# Patient Record
Sex: Female | Born: 1965 | Race: White | Hispanic: No | Marital: Married | Smoking: Never smoker
Health system: Southern US, Community
[De-identification: ages and names within clinical notes are randomized; demographics above are authoritative.]

## PROBLEM LIST (undated history)

## (undated) DIAGNOSIS — Z8619 Personal history of other infectious and parasitic diseases: Secondary | ICD-10-CM

## (undated) DIAGNOSIS — Z9109 Other allergy status, other than to drugs and biological substances: Secondary | ICD-10-CM

## (undated) DIAGNOSIS — J302 Other seasonal allergic rhinitis: Secondary | ICD-10-CM

## (undated) DIAGNOSIS — I10 Essential (primary) hypertension: Secondary | ICD-10-CM

## (undated) DIAGNOSIS — K219 Gastro-esophageal reflux disease without esophagitis: Secondary | ICD-10-CM

## (undated) DIAGNOSIS — R519 Headache, unspecified: Secondary | ICD-10-CM

## (undated) DIAGNOSIS — M1990 Unspecified osteoarthritis, unspecified site: Secondary | ICD-10-CM

## (undated) DIAGNOSIS — E785 Hyperlipidemia, unspecified: Secondary | ICD-10-CM

## (undated) DIAGNOSIS — F419 Anxiety disorder, unspecified: Secondary | ICD-10-CM

## (undated) DIAGNOSIS — R51 Headache: Secondary | ICD-10-CM

## (undated) DIAGNOSIS — T7840XA Allergy, unspecified, initial encounter: Secondary | ICD-10-CM

## (undated) DIAGNOSIS — L732 Hidradenitis suppurativa: Secondary | ICD-10-CM

## (undated) DIAGNOSIS — E119 Type 2 diabetes mellitus without complications: Secondary | ICD-10-CM

## (undated) DIAGNOSIS — N390 Urinary tract infection, site not specified: Secondary | ICD-10-CM

## (undated) HISTORY — DX: Personal history of other infectious and parasitic diseases: Z86.19

## (undated) HISTORY — DX: Essential (primary) hypertension: I10

## (undated) HISTORY — PX: CHOLECYSTECTOMY: SHX55

## (undated) HISTORY — DX: Unspecified osteoarthritis, unspecified site: M19.90

## (undated) HISTORY — DX: Anxiety disorder, unspecified: F41.9

## (undated) HISTORY — DX: Headache: R51

## (undated) HISTORY — DX: Other seasonal allergic rhinitis: J30.2

## (undated) HISTORY — DX: Allergy, unspecified, initial encounter: T78.40XA

## (undated) HISTORY — DX: Type 2 diabetes mellitus without complications: E11.9

## (undated) HISTORY — DX: Gastro-esophageal reflux disease without esophagitis: K21.9

## (undated) HISTORY — DX: Hyperlipidemia, unspecified: E78.5

## (undated) HISTORY — PX: WISDOM TOOTH EXTRACTION: SHX21

## (undated) HISTORY — PX: TUBAL LIGATION: SHX77

## (undated) HISTORY — DX: Headache, unspecified: R51.9

## (undated) HISTORY — DX: Other allergy status, other than to drugs and biological substances: Z91.09

## (undated) HISTORY — DX: Urinary tract infection, site not specified: N39.0

## (undated) HISTORY — DX: Hidradenitis suppurativa: L73.2

## (undated) HISTORY — PX: AXILLARY SURGERY: SHX892

---

## 2007-03-29 DIAGNOSIS — J329 Chronic sinusitis, unspecified: Secondary | ICD-10-CM | POA: Insufficient documentation

## 2007-05-06 DIAGNOSIS — E669 Obesity, unspecified: Secondary | ICD-10-CM | POA: Insufficient documentation

## 2007-05-06 DIAGNOSIS — I10 Essential (primary) hypertension: Secondary | ICD-10-CM | POA: Insufficient documentation

## 2012-02-23 DIAGNOSIS — J45909 Unspecified asthma, uncomplicated: Secondary | ICD-10-CM | POA: Insufficient documentation

## 2014-12-22 ENCOUNTER — Telehealth: Payer: Self-pay | Admitting: Behavioral Health

## 2014-12-22 NOTE — Telephone Encounter (Signed)
Unable to reach patient at time of Pre-Visit Call.  Left message for patient to return call when available.    

## 2014-12-25 ENCOUNTER — Encounter: Payer: Self-pay | Admitting: Physician Assistant

## 2014-12-25 ENCOUNTER — Ambulatory Visit (INDEPENDENT_AMBULATORY_CARE_PROVIDER_SITE_OTHER): Payer: Managed Care, Other (non HMO) | Admitting: Physician Assistant

## 2014-12-25 VITALS — BP 124/88 | HR 83 | Temp 98.0°F | Resp 16 | Ht 62.25 in | Wt 222.0 lb

## 2014-12-25 DIAGNOSIS — M25649 Stiffness of unspecified hand, not elsewhere classified: Secondary | ICD-10-CM

## 2014-12-25 DIAGNOSIS — M25551 Pain in right hip: Secondary | ICD-10-CM

## 2014-12-25 DIAGNOSIS — M25559 Pain in unspecified hip: Secondary | ICD-10-CM | POA: Diagnosis not present

## 2014-12-25 DIAGNOSIS — R5382 Chronic fatigue, unspecified: Secondary | ICD-10-CM | POA: Diagnosis not present

## 2014-12-25 DIAGNOSIS — G8929 Other chronic pain: Secondary | ICD-10-CM | POA: Insufficient documentation

## 2014-12-25 DIAGNOSIS — Z8679 Personal history of other diseases of the circulatory system: Secondary | ICD-10-CM | POA: Insufficient documentation

## 2014-12-25 DIAGNOSIS — K219 Gastro-esophageal reflux disease without esophagitis: Secondary | ICD-10-CM

## 2014-12-25 DIAGNOSIS — M25552 Pain in left hip: Secondary | ICD-10-CM

## 2014-12-25 DIAGNOSIS — M25569 Pain in unspecified knee: Secondary | ICD-10-CM | POA: Diagnosis not present

## 2014-12-25 DIAGNOSIS — M25562 Pain in left knee: Secondary | ICD-10-CM

## 2014-12-25 DIAGNOSIS — M25561 Pain in right knee: Secondary | ICD-10-CM

## 2014-12-25 LAB — SEDIMENTATION RATE: Sed Rate: 13 mm/hr (ref 0–22)

## 2014-12-25 LAB — BASIC METABOLIC PANEL
BUN: 15 mg/dL (ref 6–23)
CALCIUM: 9.6 mg/dL (ref 8.4–10.5)
CHLORIDE: 106 meq/L (ref 96–112)
CO2: 24 meq/L (ref 19–32)
Creatinine, Ser: 0.75 mg/dL (ref 0.40–1.20)
GFR: 87.72 mL/min (ref >60.00)
GLUCOSE: 95 mg/dL (ref 70–99)
Potassium: 4.4 mEq/L (ref 3.5–5.1)
SODIUM: 138 meq/L (ref 135–145)

## 2014-12-25 LAB — RHEUMATOID FACTOR

## 2014-12-25 LAB — CBC
HEMATOCRIT: 37.8 % (ref 36.0–46.0)
HEMOGLOBIN: 12.8 g/dL (ref 12.0–15.0)
MCHC: 33.9 g/dL (ref 30.0–36.0)
MCV: 82.7 fl (ref 78.0–100.0)
Platelets: 316 10*3/uL (ref 150.0–400.0)
RBC: 4.57 Mil/uL (ref 3.87–5.11)
RDW: 15.6 % — AB (ref 11.5–15.5)
WBC: 6.9 10*3/uL (ref 4.0–10.5)

## 2014-12-25 LAB — VITAMIN D 25 HYDROXY (VIT D DEFICIENCY, FRACTURES): VITD: 19.05 ng/mL — AB (ref 30.00–100.00)

## 2014-12-25 LAB — TSH: TSH: 1.94 u[IU]/mL (ref 0.35–4.50)

## 2014-12-25 LAB — T4, FREE: Free T4: 1.04 ng/dL (ref 0.60–1.60)

## 2014-12-25 MED ORDER — DICLOFENAC SODIUM 1 % TD GEL: 2.0000 g | Freq: Four times a day (QID) | TRANSDERMAL | Status: DC

## 2014-12-25 MED ORDER — PANTOPRAZOLE SODIUM 40 MG PO TBEC: 40.0000 mg | DELAYED_RELEASE_TABLET | Freq: Every day | ORAL | Status: DC

## 2014-12-25 MED ORDER — TRAMADOL HCL 50 MG PO TABS: 50.0000 mg | ORAL_TABLET | Freq: Two times a day (BID) | ORAL | Status: DC | PRN

## 2014-12-25 NOTE — Assessment & Plan Note (Signed)
>   1 hour.  Will check ESR and RF today. Rx Voltaren gel. Rx Tramadol for severe pain. Follow-up based on lab results

## 2014-12-25 NOTE — Assessment & Plan Note (Signed)
Unclear etiology. Will check CBC, BMP, ESR, T4, TSH and Vitamin D.

## 2014-12-25 NOTE — Progress Notes (Signed)
Pre visit review using our clinic review tool, if applicable. No additional management support is needed unless otherwise documented below in the visit note/SLS  

## 2014-12-25 NOTE — Patient Instructions (Signed)
Please take an Aleve twice daily as directed. Use Tramadol for severe pain. Apply the Voltaren as directed topically. Go to the lab for blood work. I will call you with your results.  Please take Protonix daily for GERD symptoms. Avoid late night eating and spicy foods.  Follow-up 1 month.  Food Choices for Gastroesophageal Reflux Disease When you have gastroesophageal reflux disease (GERD), the foods you eat and your eating habits are very important. Choosing the right foods can help ease the discomfort of GERD. WHAT GENERAL GUIDELINES DO I NEED TO FOLLOW?  Choose fruits, vegetables, whole grains, low-fat dairy products, and low-fat meat, fish, and poultry.  Limit fats such as oils, salad dressings, butter, nuts, and avocado.  Keep a food diary to identify foods that cause symptoms.  Avoid foods that cause reflux. These may be different for different people.  Eat frequent small meals instead of three large meals each day.  Eat your meals slowly, in a relaxed setting.  Limit fried foods.  Cook foods using methods other than frying.  Avoid drinking alcohol.  Avoid drinking large amounts of liquids with your meals.  Avoid bending over or lying down until 2-3 hours after eating. WHAT FOODS ARE NOT RECOMMENDED? The following are some foods and drinks that may worsen your symptoms: Vegetables Tomatoes. Tomato juice. Tomato and spaghetti sauce. Chili peppers. Onion and garlic. Horseradish. Fruits Oranges, grapefruit, and lemon (fruit and juice). Meats High-fat meats, fish, and poultry. This includes hot dogs, ribs, ham, sausage, salami, and bacon. Dairy Whole milk and chocolate milk. Sour cream. Cream. Butter. Ice cream. Cream cheese.  Beverages Coffee and tea, with or without caffeine. Carbonated beverages or energy drinks. Condiments Hot sauce. Barbecue sauce.  Sweets/Desserts Chocolate and cocoa. Donuts. Peppermint and spearmint. Fats and Oils High-fat foods,  including [place] fries and potato chips. Other Vinegar. Strong spices, such as black pepper, white pepper, red pepper, cayenne, curry powder, cloves, ginger, and chili powder. The items listed above may not be a complete list of foods and beverages to avoid. Contact your dietitian for more information. Document Released: 04/14/2005 Document Revised: 04/19/2013 Document Reviewed: 02/16/2013 [facility] Patient Information 2015 [place], [place]. This information is not intended to replace advice given to you by your health care provider. Make sure you discuss any questions you have with your health care provider.

## 2014-12-25 NOTE — Progress Notes (Signed)
Patient presents to clinic today to establish care.  Acute Concerns: Patient c/o low energy levels over the past 1-2 years. Endorses now a struggle to make it through the work day. Endorses bilateral hip and hand pain that is achy and lasting all day. Knees and elbows also affected but much less so. Endorses joint stiffness of hands and wrists lasting > 1 hour a day. Does heavy lifting throughout the work day. Is taking Ibuprofen throughout day with little relief of symptoms. Denies family history of rheumatoid arthritis. Mother with hypothyroidism. Denies hx of anemia.  Patient c/o chronic GERD symptoms with significant indigestion. Denies tenesmus, melena or hematochezia. Symptoms worse on waking. Has not seen GI in many years. Is taking alka-seltzer with some relief of symptoms.  Chronic Issues: Hypertension -- Previously on lisinopril 20 mg daily. Patient denies chest pain, palpitations, lightheadedness, dizziness, vision changes or frequent headaches.  Seasonal/Environmental Allergies -- Benadryl as needed for symptoms relief with good results.  Health Maintenance: Mammogram -- Last Mammogram 2 years ago. No history of concerning findings on imaging. Is willing to let us set up.  Past Medical History  Diagnosis Date  . History of chicken pox   . Hypertension   . Seasonal allergies   . Environmental allergies   . Hidradenitis   . GERD (gastroesophageal reflux disease)   . Frequent headaches   . UTI (lower urinary tract infection)   . Arthritis     Past Surgical History  Procedure Laterality Date  . Axillary surgery      Sweat glands removed, bilateral 2007  . Wisdom tooth extraction    . Tubal ligation      1996  . Cholecystectomy      2002    No current outpatient prescriptions on file prior to visit.   No current facility-administered medications on file prior to visit.    Allergies  Allergen Reactions  . Anesthetics, Ester Nausea And Vomiting  . Penicillins  Hives    Family History  Problem Relation Age of Onset  . Hypertension Mother     Living  . COPD Mother   . Asthma Mother   . Scoliosis Mother   . Heart disease Father 46    Deceased  . Hyperlipidemia Father   . Lymphoma Father   . COPD Father   . Congestive Heart Failure Father   . Heart attack Father   . Uterine cancer Maternal Grandmother   . Anuerysm Father   . Aneurysm Paternal Grandfather   . Heart disease Paternal Grandfather   . Lung cancer Maternal Grandfather   . Diabetes Paternal Grandmother   . Hypertension Maternal Grandmother   . COPD Other     Maternal Aunts & Uncles  . Heart disease Other     Maternal Aunts & Uncles  . Aneurysm Other     Paternal uncles x3  . Aneurysm Brother     #1  . Asthma Brother     #1  . Diabetes Brother     #1  . Asthma Brother   . Breast cancer Sister   . Cardiomyopathy Sister   . Arthritis Sister   . Arthritis Father   . Lymphoma Son     Gardiner Sleeper -- 5 years Remission  . Scoliosis Daughter     x1  . Healthy Son     #2    Social History   Social History  . Marital Status: Married    Spouse Name: N/A  . Number  of Children: N/A  . 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 Smoking status: Never Smoker   . Smokeless tobacco: Never Used  . Alcohol Use: No  . Drug Use: No  . Sexual Activity:    Partners: Male   Other Topics Concern  . Not on file   Social History Narrative  . No narrative on file   Review of Systems  Constitutional: Positive for malaise/fatigue. Negative for fever and weight loss.  Gastrointestinal: Positive for heartburn. Negative for nausea, vomiting, abdominal pain, diarrhea, constipation, blood in stool and melena.  Musculoskeletal: Positive for myalgias, back pain and joint pain. Negative for falls and neck pain.  Neurological: Positive for weakness. Negative for dizziness, tingling and sensory change.   BP  124/88 mmHg  Pulse 83  Temp(Src) 98 F (36.7 C) (Oral)  Resp 16  Ht 5' 2.25" (1.581 m)  Wt 222 lb (100.699 kg)  BMI 40.29 kg/m2  SpO2 99%  LMP 12/11/2014  Physical Exam  Constitutional: She is oriented to person, place, and time and well-developed, well-nourished, and in no distress.  HENT:  Head: Normocephalic and atraumatic.  Eyes: Conjunctivae are normal.  Cardiovascular: Normal rate, regular rhythm, normal heart sounds and intact distal pulses.   Pulmonary/Chest: Effort normal and breath sounds normal. No respiratory distress. She has no wheezes. She has no rales. She exhibits no tenderness.  Abdominal: Soft. Bowel sounds are normal. She exhibits no distension and no mass. There is no tenderness. There is no rebound and no guarding.  Musculoskeletal:       Right wrist: She exhibits tenderness. She exhibits no bony tenderness.       Left wrist: She exhibits tenderness. She exhibits no bony tenderness.       Right hip: She exhibits tenderness.       Left hip: She exhibits tenderness. She exhibits no bony tenderness.       Right hand: She exhibits normal range of motion, no tenderness, normal capillary refill and no swelling.       Left hand: She exhibits normal range of motion, no tenderness, normal capillary refill and no swelling.  Neurological: She is alert and oriented to person, place, and time.  Skin: Skin is warm and dry. No rash noted.  Psychiatric: Affect normal.  Vitals reviewed.    Assessment/Plan: Joint stiffness of hand > 1 hour.  Will check ESR and RF today. Rx Voltaren gel. Rx Tramadol for severe pain. Follow-up based on lab results  History of hypertension Has been off of BP medication for > 9 months. BP stable. Asymptomatic. Continue diet and lifestyle interventions. Will routinely monitor.  Gastroesophageal reflux disease without esophagitis Rx Protonix. GERD diet discussed. Follow-up 1 month.  Chronic fatigue Unclear etiology. Will check CBC, BMP, ESR,  T4, TSH and Vitamin D.  Chronic arthralgias of knees and hips OA versus RA. Rheumatoid panel will be checked today. Topical Voltaren. Rx Tramadol for severe pain. Aleve for mild pain. Follow-up based on lab results.

## 2014-12-25 NOTE — Assessment & Plan Note (Signed)
OA versus RA. Rheumatoid panel will be checked today. Topical Voltaren. Rx Tramadol for severe pain. Aleve for mild pain. Follow-up based on lab results.

## 2014-12-25 NOTE — Assessment & Plan Note (Signed)
Rx Protonix. GERD diet discussed. Follow-up 1 month.

## 2014-12-25 NOTE — Assessment & Plan Note (Signed)
Has been off of BP medication for > 9 months. BP stable. Asymptomatic. Continue diet and lifestyle interventions. Will routinely monitor.

## 2014-12-27 ENCOUNTER — Telehealth: Payer: Self-pay | Admitting: *Deleted

## 2014-12-27 MED ORDER — ERGOCALCIFEROL 1.25 MG (50000 UT) PO CAPS: 50000.0000 [IU] | ORAL_CAPSULE | ORAL | Status: DC

## 2014-12-27 NOTE — Telephone Encounter (Signed)
-----   Message from Waldon Merl, PA-C sent at 12/26/2014  7:13 AM EDT ----- Labs look great overall. Vitamin D level is low which is likely contributing to fatigue. Recommend 50,000 unit once weekly x 8 weeks to increase levels. Will then likely need to Rx a maintenance dose to keep levels up. I highly recommend this. Ok to send in Rx if she is willing. Follow-up 2 months to recheck levels. All rheumatoid panels are negative so joint pain most likely significant OA. Continue measures discussed at visit and follow-up 2 months. If Voltaren and Tramadol not helping, we may want to consider other options for treatment.

## 2014-12-27 NOTE — Telephone Encounter (Signed)
Patient informed, understood & agreed; new Rx sent to pharmacy/SLS  Notes Recorded by Verdie Shire, CMA on 12/25/2014 at 3:24 PM Called and LM asking the pt to RTC regarding recent lab results.//AB/CMA Notes Recorded by Waldon Merl, PA-C on 12/24/2014 at 5:15 PM Labs good overall. Renal function is slightly impaired compared to prior checks. Want to repeat CMP in one week (lab only). Come hydrated for blood work. If still abnormal, we will look into this further.

## 2015-01-24 ENCOUNTER — Ambulatory Visit (INDEPENDENT_AMBULATORY_CARE_PROVIDER_SITE_OTHER): Payer: Managed Care, Other (non HMO) | Admitting: Physician Assistant

## 2015-01-24 ENCOUNTER — Encounter: Payer: Self-pay | Admitting: Physician Assistant

## 2015-01-24 VITALS — BP 116/86 | HR 99 | Temp 97.9°F | Resp 16 | Ht 62.0 in | Wt 225.5 lb

## 2015-01-24 DIAGNOSIS — M25569 Pain in unspecified knee: Secondary | ICD-10-CM | POA: Diagnosis not present

## 2015-01-24 DIAGNOSIS — K219 Gastro-esophageal reflux disease without esophagitis: Secondary | ICD-10-CM | POA: Diagnosis not present

## 2015-01-24 DIAGNOSIS — E559 Vitamin D deficiency, unspecified: Secondary | ICD-10-CM | POA: Diagnosis not present

## 2015-01-24 DIAGNOSIS — M25559 Pain in unspecified hip: Secondary | ICD-10-CM

## 2015-01-24 MED ORDER — TRAMADOL HCL (ER BIPHASIC) 200 MG PO TB24: ORAL_TABLET | ORAL | Status: DC

## 2015-01-24 MED ORDER — CELECOXIB 100 MG PO CAPS: 100.0000 mg | ORAL_CAPSULE | Freq: Every day | ORAL | Status: DC

## 2015-01-24 NOTE — Progress Notes (Signed)
Patient presents to clinic today for 1 month follow-up of arthritis and GERD.  Arthritis -- Is taking Tramadol every 12 hours with some relief in symptoms along with an occasional Aleve.  GERD -- Patient endorses some improvement with Protonix but only very mild improvement. Still having bloating and grumbling of stomach. Heartburn is improved but only 1-2 times per week. Is not taking a Probiotic.  Is due for repeat Vitamin D levels after finishing treatment course with 50,000 units weekly.  Past Medical History  Diagnosis Date  . History of chicken pox   . Hypertension   . Seasonal allergies   . Environmental allergies   . Hidradenitis   . GERD (gastroesophageal reflux disease)   . Frequent headaches   . UTI (lower urinary tract infection)   . Arthritis     Current Outpatient Prescriptions on File Prior to Visit  Medication Sig Dispense Refill  . diphenhydrAMINE (BENADRYL) 25 mg capsule Take 25 mg by mouth at bedtime.    . ergocalciferol (VITAMIN D2) 50000 UNITS capsule Take 1 capsule (50,000 Units total) by mouth once a week. 8 capsule 0  . ibuprofen (ADVIL,MOTRIN) 200 MG tablet Take 200 mg by mouth every 6 (six) hours as needed.    . pantoprazole (PROTONIX) 40 MG tablet Take 1 tablet (40 mg total) by mouth daily. 30 tablet 3   No current facility-administered medications on file prior to visit.    Allergies  Allergen Reactions  . Anesthetics, Ester Nausea And Vomiting  . Penicillins Hives    Family History  Problem Relation Age of Onset  . Hypertension Mother     Living  . COPD Mother   . Asthma Mother   . Scoliosis Mother   . Heart disease Father 47    Deceased  . Hyperlipidemia Father   . Lymphoma Father   . COPD Father   . Congestive Heart Failure Father   . Heart attack Father   . Uterine cancer Maternal Grandmother   . Anuerysm Father   . Aneurysm Paternal Grandfather   . Heart disease Paternal Grandfather   . Lung cancer Maternal Grandfather   .  Diabetes Paternal Grandmother   . Hypertension Maternal Grandmother   . COPD Other     Maternal Aunts & Uncles  . Heart disease Other     Maternal Aunts & Uncles  . Aneurysm Other     Paternal uncles x3  . Aneurysm Brother     #1  . Asthma Brother     #1  . Diabetes Brother     #1  . Asthma Brother   . Breast cancer Sister   . Cardiomyopathy Sister   . Arthritis Sister   . Arthritis Father   . Lymphoma Son     Gardiner Sleeper -- 5 years Remission  . Scoliosis Daughter     x1  . Healthy Son     #2    Social History   Social History  . Marital Status: Married    Spouse Name: N/A  . Number of Children: N/A  . 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 Smoking status: Never Smoker   . Smokeless tobacco: Never Used  . Alcohol Use: No  . Drug Use: No  . Sexual Activity:    Partners: Male   Other Topics Concern  . None   Social History Narrative    Review  of Systems - See HPI.  All other ROS are negative.  BP 116/86 mmHg  Pulse 99  Temp(Src) 97.9 F (36.6 C) (Oral)  Resp 16  Ht 5' 2"  (1.575 m)  Wt 225 lb 8 oz (102.286 kg)  BMI 41.23 kg/m2  SpO2 98%  LMP 01/07/2015  Physical Exam  Constitutional: She is well-developed, well-nourished, and in no distress.  HENT:  Head: Normocephalic and atraumatic.  Eyes: Conjunctivae are normal.  Cardiovascular: Normal rate, regular rhythm, normal heart sounds and intact distal pulses.   Pulmonary/Chest: Effort normal.  Vitals reviewed.   Recent Results (from the past 2160 hour(s))  CBC     Status: Abnormal   Collection Time: 12/25/14  8:56 AM  Result Value Ref Range   WBC 6.9 4.0 - 10.5 K/uL   RBC 4.57 3.87 - 5.11 Mil/uL   Platelets 316.0 150.0 - 400.0 K/uL   Hemoglobin 12.8 12.0 - 15.0 g/dL   HCT 37.8 36.0 - 46.0 %   MCV 82.7 78.0 - 100.0 fl   MCHC 33.9 30.0 - 36.0 g/dL   RDW 15.6 (H) 11.5 - 78.6 %  Basic Metabolic Panel (BMET)      Status: None   Collection Time: 12/25/14  8:56 AM  Result Value Ref Range   Sodium 138 135 - 145 mEq/L   Potassium 4.4 3.5 - 5.1 mEq/L   Chloride 106 96 - 112 mEq/L   CO2 24 19 - 32 mEq/L   Glucose, Bld 95 70 - 99 mg/dL   BUN 15 6 - 23 mg/dL   Creatinine, Ser 0.75 0.40 - 1.20 mg/dL   Calcium 9.6 8.4 - 10.5 mg/dL   GFR 87.72 >60.00 mL/min  Sed Rate (ESR)     Status: None   Collection Time: 12/25/14  8:56 AM  Result Value Ref Range   Sed Rate 13 0 - 22 mm/hr  Rheumatoid Factor     Status: None   Collection Time: 12/25/14  8:56 AM  Result Value Ref Range   Rhuematoid fact SerPl-aCnc <10 <=14 IU/mL    Comment:                            Interpretive Table                     Low Positive: 15 - 41 IU/mL                     High Positive:  >= 42 IU/mL    In addition to the RF result, and clinical symptoms including joint  involvement, the 2010 ACR Classification Criteria for  scoring/diagnosing Rheumatoid Arthritis include the results of the  following tests:  CRP (75449), ESR (15010), and CCP (APCA) (20100).  www.rheumatology.org/practice/clinical/classification/ra/ra_2010.asp   TSH     Status: None   Collection Time: 12/25/14  8:56 AM  Result Value Ref Range   TSH 1.94 0.35 - 4.50 uIU/mL  T4, free     Status: None   Collection Time: 12/25/14  8:56 AM  Result Value Ref Range   Free T4 1.04 0.60 - 1.60 ng/dL  Vitamin D (25 hydroxy)     Status: Abnormal   Collection Time: 12/25/14  8:57 AM  Result Value Ref Range   VITD 19.05 (L) 30.00 - 100.00 ng/mL  Vitamin D (25 hydroxy)     Status: Abnormal   Collection Time: 01/24/15  3:36 PM  Result Value Ref Range  VITD 26.91 (L) 30.00 - 100.00 ng/mL    Assessment/Plan: Vitamin D deficiency Will repeat Vitamin D level  Gastroesophageal reflux disease without esophagitis Continue Protonix as directed. Will add-on daily Probiotic to help with bloating. Increase fluids. Weight loss encouraged. GERD diet reviewed. Follow-up if  symptoms are not resolving.  Chronic arthralgias of knees and hips Will stop regular release Tramadol. Rx Tramadol ER 200 mg daily. Celebrex once daily if needed for breakthrough pains. Continue topical Voltaren.

## 2015-01-24 NOTE — Patient Instructions (Signed)
Please start the ER Tramadol daily. Use Celebrex sparingly for breakthrough pain as directed. Continue Protonix daily and start a daily Probiotic.  Call me in 3-4 weeks to let me know how you are doing. Follow-up in office in 3 months.

## 2015-01-24 NOTE — Progress Notes (Signed)
Pre visit review using our clinic review tool, if applicable. No additional management support is needed unless otherwise documented below in the visit note/SLS  

## 2015-01-25 LAB — VITAMIN D 25 HYDROXY (VIT D DEFICIENCY, FRACTURES): VITD: 26.91 ng/mL — ABNORMAL LOW (ref 30.00–100.00)

## 2015-01-25 NOTE — Assessment & Plan Note (Signed)
Will repeat Vitamin D level

## 2015-01-25 NOTE — Assessment & Plan Note (Signed)
Will stop regular release Tramadol. Rx Tramadol ER 200 mg daily. Celebrex once daily if needed for breakthrough pains. Continue topical Voltaren.

## 2015-01-25 NOTE — Assessment & Plan Note (Signed)
Continue Protonix as directed. Will add-on daily Probiotic to help with bloating. Increase fluids. Weight loss encouraged. GERD diet reviewed. Follow-up if symptoms are not resolving.

## 2015-03-20 ENCOUNTER — Other Ambulatory Visit: Payer: Self-pay | Admitting: Physician Assistant

## 2015-03-21 NOTE — Telephone Encounter (Signed)
Rx request faxed to pharmacy/SLS  

## 2015-04-03 ENCOUNTER — Encounter: Payer: Self-pay | Admitting: Physician Assistant

## 2015-04-03 ENCOUNTER — Ambulatory Visit (INDEPENDENT_AMBULATORY_CARE_PROVIDER_SITE_OTHER): Payer: Managed Care, Other (non HMO) | Admitting: Physician Assistant

## 2015-04-03 VITALS — BP 128/90 | HR 91 | Temp 97.9°F | Ht 62.0 in | Wt 225.6 lb

## 2015-04-03 DIAGNOSIS — J019 Acute sinusitis, unspecified: Secondary | ICD-10-CM

## 2015-04-03 DIAGNOSIS — M25569 Pain in unspecified knee: Secondary | ICD-10-CM

## 2015-04-03 DIAGNOSIS — B9689 Other specified bacterial agents as the cause of diseases classified elsewhere: Secondary | ICD-10-CM | POA: Insufficient documentation

## 2015-04-03 DIAGNOSIS — M25559 Pain in unspecified hip: Secondary | ICD-10-CM | POA: Diagnosis not present

## 2015-04-03 MED ORDER — CELECOXIB 100 MG PO CAPS: 100.0000 mg | ORAL_CAPSULE | Freq: Two times a day (BID) | ORAL | Status: DC

## 2015-04-03 MED ORDER — AZITHROMYCIN 250 MG PO TABS: ORAL_TABLET | ORAL | Status: DC

## 2015-04-03 NOTE — Patient Instructions (Signed)
Please keep leg elevated and knee iced.  Wear knee sleeve daily. Restart Celebrex taking twice daily with food. Tylenol for breakthrough pain. I am writing you out of work until Friday.  Please take antibiotic as directed.  Increase fluid intake.  Use Saline nasal spray.  Take a daily multivitamin. .  Place a humidifier in the bedroom.  Please call or return clinic if symptoms are not improving.  Sinusitis Sinusitis is redness, soreness, and swelling (inflammation) of the paranasal sinuses. Paranasal sinuses are air pockets within the bones of your face (beneath the eyes, the middle of the forehead, or above the eyes). In healthy paranasal sinuses, mucus is able to drain out, and air is able to circulate through them by way of your nose. However, when your paranasal sinuses are inflamed, mucus and air can become trapped. This can allow bacteria and other germs to grow and cause infection. Sinusitis can develop quickly and last only a short time (acute) or continue over a long period (chronic). Sinusitis that lasts for more than 12 weeks is considered chronic.  CAUSES  Causes of sinusitis include:  Allergies.  Structural abnormalities, such as displacement of the cartilage that separates your nostrils (deviated septum), which can decrease the air flow through your nose and sinuses and affect sinus drainage.  Functional abnormalities, such as when the small hairs (cilia) that line your sinuses and help remove mucus do not work properly or are not present. SYMPTOMS  Symptoms of acute and chronic sinusitis are the same. The primary symptoms are pain and pressure around the affected sinuses. Other symptoms include:  Upper toothache.  Earache.  Headache.  Bad breath.  Decreased sense of smell and taste.  A cough, which worsens when you are lying flat.  Fatigue.  Fever.  Thick drainage from your nose, which often is green and may contain pus (purulent).  Swelling and warmth over the  affected sinuses. DIAGNOSIS  Your caregiver will perform a physical exam. During the exam, your caregiver may:  Look in your nose for signs of abnormal growths in your nostrils (nasal polyps).  Tap over the affected sinus to check for signs of infection.  View the inside of your sinuses (endoscopy) with a special imaging device with a light attached (endoscope), which is inserted into your sinuses. If your caregiver suspects that you have chronic sinusitis, one or more of the following tests may be recommended:  Allergy tests.  Nasal culture A sample of mucus is taken from your nose and sent to a lab and screened for bacteria.  Nasal cytology A sample of mucus is taken from your nose and examined by your caregiver to determine if your sinusitis is related to an allergy. TREATMENT  Most cases of acute sinusitis are related to a viral infection and will resolve on their own within 10 days. Sometimes medicines are prescribed to help relieve symptoms (pain medicine, decongestants, nasal steroid sprays, or saline sprays).  However, for sinusitis related to a bacterial infection, your caregiver will prescribe antibiotic medicines. These are medicines that will help kill the bacteria causing the infection.  Rarely, sinusitis is caused by a fungal infection. In theses cases, your caregiver will prescribe antifungal medicine. For some cases of chronic sinusitis, surgery is needed. Generally, these are cases in which sinusitis recurs more than 3 times per year, despite other treatments. HOME CARE INSTRUCTIONS   Drink plenty of water. Water helps thin the mucus so your sinuses can drain more easily.  Use a humidifier.  Inhale steam 3 to 4 times a day (for example, sit in the bathroom with the shower running).  Apply a warm, moist washcloth to your face 3 to 4 times a day, or as directed by your caregiver.  Use saline nasal sprays to help moisten and clean your sinuses.  Take over-the-counter or  prescription medicines for pain, discomfort, or fever only as directed by your caregiver. SEEK IMMEDIATE MEDICAL CARE IF:  You have increasing pain or severe headaches.  You have nausea, vomiting, or drowsiness.  You have swelling around your face.  You have vision problems.  You have a stiff neck.  You have difficulty breathing. MAKE SURE YOU:   Understand these instructions.  Will watch your condition.  Will get help right away if you are not doing well or get worse. Document Released: 04/14/2005 Document Revised: 07/07/2011 Document Reviewed: 04/29/2011 [facility] Patient Information 2014 [place], [place].

## 2015-04-03 NOTE — Progress Notes (Signed)
Patient presents to clinic today c/o sinus pressure x 2 weeks. 2 days ago noted sinus pain with foul-smelling rhinorrhea and facial pain. Endorses aches. Denies fever, chills. Endorses dry cough. Denies recent travel or sick contact.  Endorses increased pain in R knee over past week. Denies trauma or injury. Has noted mild swelling. Pain is mainly posterior. Worse with ambulation. Is taking Celebrex as directed only when needed.   Past Medical History  Diagnosis Date  . History of chicken pox   . Hypertension   . Seasonal allergies   . Environmental allergies   . Hidradenitis   . GERD (gastroesophageal reflux disease)   . Frequent headaches   . UTI (lower urinary tract infection)   . Arthritis     Current Outpatient Prescriptions on File Prior to Visit  Medication Sig Dispense Refill  . diphenhydrAMINE (BENADRYL) 25 mg capsule Take 25 mg by mouth at bedtime.    . pantoprazole (PROTONIX) 40 MG tablet Take 1 tablet (40 mg total) by mouth daily. 30 tablet 3  . ergocalciferol (VITAMIN D2) 50000 UNITS capsule Take 1 capsule (50,000 Units total) by mouth once a week. (Patient not taking: Reported on 04/03/2015) 8 capsule 0  . ibuprofen (ADVIL,MOTRIN) 200 MG tablet Take 200 mg by mouth every 6 (six) hours as needed.     No current facility-administered medications on file prior to visit.    Allergies  Allergen Reactions  . Anesthetics, Ester Nausea And Vomiting  . Penicillins Hives    Family History  Problem Relation Age of Onset  . Hypertension Mother     Living  . COPD Mother   . Asthma Mother   . Scoliosis Mother   . Heart disease Father [id number]    Deceased  . Hyperlipidemia Father   . Lymphoma Father   . COPD Father   . Congestive Heart Failure Father   . Heart attack Father   . Uterine cancer Maternal Grandmother   . Anuerysm Father   . Aneurysm Paternal Grandfather   . Heart disease Paternal Grandfather   . Lung cancer Maternal Grandfather   . Diabetes Paternal  Grandmother   . Hypertension Maternal Grandmother   . COPD Other     Maternal Aunts & Uncles  . Heart disease Other     Maternal Aunts & Uncles  . Aneurysm Other     Paternal uncles x3  . Aneurysm Brother     #1  . Asthma Brother     #1  . Diabetes Brother     #1  . Asthma Brother   . Breast cancer Sister   . Cardiomyopathy Sister   . Arthritis Sister   . Arthritis Father   . Lymphoma Son     Diego CoryGray Cell -- 5 years Remission  . Scoliosis Daughter     x1  . Healthy Son     #2    Social History   Social History  . Marital Status: Married    Spouse Name: N/A  . Number of Children: N/A  . 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 Smoking status: Never Smoker   . Smokeless tobacco: Never Used  . Alcohol Use: No  . Drug Use: No  . Sexual Activity:    Partners: Male   Other Topics Concern  . None   Social History Narrative   Review of Systems - See HPI.  All other  ROS are negative.  BP 128/90 mmHg  Pulse 91  Temp(Src) 97.9 F (36.6 C) (Oral)  Ht  (1.575 m)  Wt 225 lb 9.6 oz (102.331 kg)  BMI 41.25 kg/m2  SpO2 98%  LMP 03/27/2015  Physical Exam  Constitutional: She is oriented to person, place, and time and well-developed, well-nourished, and in no distress.  HENT:  Head: Normocephalic and atraumatic.  Right Ear: External ear normal.  Left Ear: External ear normal.  Mouth/Throat: Oropharynx is clear and moist.  + TTP of sinuses on exam. Nasal turibinates swollen  Eyes: Conjunctivae are normal.  Cardiovascular: Normal rate, regular rhythm, normal heart sounds and intact distal pulses.   Pulmonary/Chest: Effort normal and breath sounds normal. No respiratory distress. She has no wheezes. She has no rales. She exhibits no tenderness.  Musculoskeletal:       Right knee: She exhibits normal range of motion, no swelling, normal alignment, no LCL laxity, normal patellar mobility,  no bony tenderness, normal meniscus and no MCL laxity. Tenderness found. Lateral joint line tenderness noted.  Neurological: She is alert and oriented to person, place, and time.  Skin: Skin is warm and dry. No rash noted.  Psychiatric: Affect normal.  Vitals reviewed.   Recent Results (from the past 2160 hour(s))  Vitamin D (25 hydroxy)     Status: Abnormal   Collection Time: 01/24/15  3:36 PM  Result Value Ref Range   VITD 26.91 (L) 30.00 - 100.00 ng/mL    Assessment/Plan: Acute bacterial sinusitis Rx Azithromycin.  Increase fluids.  Rest.  Saline nasal spray.  Probiotic.  Mucinex as directed.  Humidifier in bedroom.  Call or return to clinic if symptoms are not improving.   Chronic arthralgias of knees and hips With exacerbation of R knee pain with lateral pain with ambulation. Joint stable. No swelling. RICE recommended. Knee sleeve daily. Restart Celebrex, taking twice daily. If not improving quickly, will need imaging and referral.

## 2015-04-03 NOTE — Assessment & Plan Note (Signed)
With exacerbation of R knee pain with lateral pain with ambulation. Joint stable. No swelling. RICE recommended. Knee sleeve daily. Restart Celebrex, taking twice daily. If not improving quickly, will need imaging and referral.

## 2015-04-03 NOTE — Progress Notes (Signed)
Pre visit review using our clinic review tool, if applicable. No additional management support is needed unless otherwise documented below in the visit note. 

## 2015-04-03 NOTE — Assessment & Plan Note (Signed)
Rx Azithromycin.  Increase fluids.  Rest.  Saline nasal spray.  Probiotic.  Mucinex as directed.  Humidifier in bedroom.  Call or return to clinic if symptoms are not improving.  

## 2015-04-05 ENCOUNTER — Encounter: Payer: Self-pay | Admitting: Physician Assistant

## 2015-04-06 ENCOUNTER — Ambulatory Visit: Payer: Self-pay | Admitting: Physician Assistant

## 2015-04-09 ENCOUNTER — Ambulatory Visit (INDEPENDENT_AMBULATORY_CARE_PROVIDER_SITE_OTHER): Payer: Managed Care, Other (non HMO) | Admitting: Physician Assistant

## 2015-04-09 ENCOUNTER — Encounter: Payer: Self-pay | Admitting: Physician Assistant

## 2015-04-09 ENCOUNTER — Ambulatory Visit (HOSPITAL_BASED_OUTPATIENT_CLINIC_OR_DEPARTMENT_OTHER)
Admission: RE | Admit: 2015-04-09 | Discharge: 2015-04-09 | Disposition: A | Discharge status: Home / Self Care | Payer: Managed Care, Other (non HMO) | Source: Ambulatory Visit | Attending: Physician Assistant | Admitting: Physician Assistant

## 2015-04-09 VITALS — BP 116/92 | HR 99 | Temp 98.1°F | Ht 62.0 in | Wt 225.0 lb

## 2015-04-09 DIAGNOSIS — M25561 Pain in right knee: Secondary | ICD-10-CM | POA: Insufficient documentation

## 2015-04-09 MED ORDER — METHOCARBAMOL 500 MG PO TABS: 500.0000 mg | ORAL_TABLET | Freq: Three times a day (TID) | ORAL | Status: DC

## 2015-04-09 NOTE — Progress Notes (Signed)
Pre visit review using our clinic review tool, if applicable. No additional management support is needed unless otherwise documented below in the visit note. 

## 2015-04-09 NOTE — Assessment & Plan Note (Signed)
Change in symptoms from her baseline arthralgias. Will obtain x-ray today. Concerned there is a muscular component to symptoms giving locations of pain. Continue Celebrex. Restart Tramadol. Will take pt out of work this week to rest. Referral to Sports Medicine placed for further evaluation.

## 2015-04-09 NOTE — Patient Instructions (Signed)
Please rest and continue icing the knee when swelling is present. Avoid the brace if you feel it is worsening symptoms.  Please go downstairs for x-ray. I will call with results. You will be contacted by Sports Medicine for assessment.  Try the Robaxin as directed for spasms. Continue the Celebrex.  Have your work send over [employer]ML[employer] or [employer] paperwork. I am taking you out of work this week.

## 2015-04-09 NOTE — Progress Notes (Signed)
Patient presents to clinic today c/o worsened R knee pain described as aching and pulling. Has been taking Celebrex as directed. Has not taken her Tramadol. Endorses symptoms were improving last week until she went back to work. Pain is exacerbated by prolonged standing and extension of the knee. Denies numbness, tingling or weakness. Swelling has improved.  Past Medical History  Diagnosis Date  . History of chicken pox   . Hypertension   . Seasonal allergies   . Environmental allergies   . Hidradenitis   . GERD (gastroesophageal reflux disease)   . Frequent headaches   . UTI (lower urinary tract infection)   . Arthritis     Current Outpatient Prescriptions on File Prior to Visit  Medication Sig Dispense Refill  . celecoxib (CELEBREX) 100 MG capsule Take 1 capsule (100 mg total) by mouth 2 (two) times daily. 60 capsule 1  . diphenhydrAMINE (BENADRYL) 25 mg capsule Take 25 mg by mouth at bedtime.    . Multiple Vitamin (MULTIVITAMIN) tablet Take 1 tablet by mouth daily.    . pantoprazole (PROTONIX) 40 MG tablet Take 1 tablet (40 mg total) by mouth daily. 30 tablet 3  . ergocalciferol (VITAMIN D2) 50000 UNITS capsule Take 1 capsule (50,000 Units total) by mouth once a week. (Patient not taking: Reported on 04/03/2015) 8 capsule 0  . ibuprofen (ADVIL,MOTRIN) 200 MG tablet Take 200 mg by mouth every 6 (six) hours as needed.     No current facility-administered medications on file prior to visit.    Allergies  Allergen Reactions  . Anesthetics, Ester Nausea And Vomiting  . Penicillins Hives    Family History  Problem Relation Age of Onset  . Hypertension Mother     Living  . COPD Mother   . Asthma Mother   . Scoliosis Mother   . Heart disease Father 48    Deceased  . Hyperlipidemia Father   . Lymphoma Father   . COPD Father   . Congestive Heart Failure Father   . Heart attack Father   . Uterine cancer Maternal Grandmother   . Anuerysm Father   . Aneurysm Paternal  Grandfather   . Heart disease Paternal Grandfather   . Lung cancer Maternal Grandfather   . Diabetes Paternal Grandmother   . Hypertension Maternal Grandmother   . COPD Other     Maternal Aunts & Uncles  . Heart disease Other     Maternal Aunts & Uncles  . Aneurysm Other     Paternal uncles x3  . Aneurysm Brother     #1  . Asthma Brother     #1  . Diabetes Brother     #1  . Asthma Brother   . Breast cancer Sister   . Cardiomyopathy Sister   . Arthritis Sister   . Arthritis Father   . Lymphoma Son     Diego Cory -- 5 years Remission  . Scoliosis Daughter     x1  . Healthy Son     #2    Social History   Social History  . Marital Status: Married    Spouse Name: N/A  . Number of Children: N/A  . 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 Smoking status: Never Smoker   . Smokeless tobacco: Never Used  . Alcohol Use: No  . Drug Use: No  . Sexual Activity:    Partners: Male  Other Topics Concern  . None   Social History Narrative    Review of Systems - See HPI.  All other ROS are negative.  BP 116/92 mmHg  Pulse 99  Temp(Src) 98.1 F (36.7 C) (Oral)  Ht 5\' 2"  (1.575 m)  Wt 225 lb (102.059 kg)  BMI 41.14 kg/m2  SpO2 97%  LMP 03/27/2015  Physical Exam  Constitutional: She is oriented to person, place, and time and well-developed, well-nourished, and in no distress.  Eyes: Conjunctivae are normal.  Neck: Neck supple.  Cardiovascular: Normal rate, regular rhythm, normal heart sounds and intact distal pulses.   Pulmonary/Chest: Effort normal and breath sounds normal. No respiratory distress. She has no wheezes. She has no rales. She exhibits no tenderness.  Musculoskeletal:       Right knee: She exhibits normal range of motion, normal alignment, no LCL laxity, normal patellar mobility, no bony tenderness, normal meniscus and no MCL laxity. Tenderness found. Medial joint line and  lateral joint line tenderness noted.  Neurological: She is alert and oriented to person, place, and time.  Skin: Skin is warm and dry. No rash noted.  Psychiatric: Affect normal.  Vitals reviewed.   Recent Results (from the past 2160 hour(s))  Vitamin D (25 hydroxy)     Status: Abnormal   Collection Time: 01/24/15  3:36 PM  Result Value Ref Range   VITD 26.91 (L) 30.00 - 100.00 ng/mL    Assessment/Plan: Right knee pain Change in symptoms from her baseline arthralgias. Will obtain x-ray today. Concerned there is a muscular component to symptoms giving locations of pain. Continue Celebrex. Restart Tramadol. Will take pt out of work this week to rest. Referral to Sports Medicine placed for further evaluation.

## 2015-04-10 ENCOUNTER — Ambulatory Visit (INDEPENDENT_AMBULATORY_CARE_PROVIDER_SITE_OTHER): Payer: Managed Care, Other (non HMO) | Admitting: Family Medicine

## 2015-04-10 ENCOUNTER — Encounter: Payer: Self-pay | Admitting: Family Medicine

## 2015-04-10 VITALS — BP 131/85 | HR 69 | Ht 62.0 in | Wt 225.0 lb

## 2015-04-10 DIAGNOSIS — M25561 Pain in right knee: Secondary | ICD-10-CM

## 2015-04-10 MED ORDER — METHYLPREDNISOLONE ACETATE 40 MG/ML IJ SUSP
40.0000 mg | Freq: Once | INTRAMUSCULAR | Status: AC
  Administered 2015-04-10: 40 mg via INTRA_ARTICULAR

## 2015-04-10 NOTE — Patient Instructions (Signed)
Your knee pain is due to arthritis, less likely a degenerative meniscus tear. Both are treated similarly initially. These are the 4 medicines you can take for this: Tylenol 500mg  1-2 tabs three times a day for pain. Aleve 1-2 tabs twice a day with food Glucosamine sulfate 750mg  twice a day is a supplement that may help. Capsaicin, aspercreme, or biofreeze topically up to four times a day may also help with pain. Cortisone injections are an option - you were given this today. If cortisone injections do not help, there are different types of shots that may help but they take longer to take effect. It's important that you continue to stay active. Straight leg raises, knee extensions 3 sets of 10 once a day (add ankle weight if these become too easy). Consider physical therapy to strengthen muscles around the joint that hurts to take pressure off of the joint itself. Shoe inserts with good arch support may be helpful. Heat or ice 15 minutes at a time 3-4 times a day as needed to help with pain. Call me if not improving and/or you want to try physical therapy, MRI. Otherwise follow up with me in 1 month.

## 2015-04-12 NOTE — Assessment & Plan Note (Signed)
consistent with DJD vs degenerative medial meniscus tear.  Both treated similarly initially.  Cortisone injection given today.  Tylenol, nsaids, topical medications, glucosamine discussed.  Shown home exercises to do daily.  F/u in 1 month for reevaluation.  Consider MRI, PT if not improving.  After informed written consent, patient was seated on exam table. Right knee was prepped with alcohol swab and utilizing anteromedial approach, patient's right knee was injected intraarticularly with 3:1 marcaine: depomedrol. Patient tolerated the procedure well without immediate complications.

## 2015-04-12 NOTE — Progress Notes (Signed)
PCP and consultation requested by Piedad Climes, PA-C  Subjective:   HPI: Patient is a 49 y.o. female here for right knee pain.  Patient denies known injury or trauma. She reports 3 weeks of gradually worsening right knee pain. 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 Pain level 3/10, up to 8/10 by end of shift. Worse with a lot of walking, steps. Pain radiates to back of knee. Worse with twisting - sharp pain. No skin changes, fever, other complaints.  Past Medical History  Diagnosis Date  . History of chicken pox   . Hypertension   . Seasonal allergies   . Environmental allergies   . Hidradenitis   . GERD (gastroesophageal reflux disease)   . Frequent headaches   . UTI (lower urinary tract infection)   . Arthritis     Current Outpatient Prescriptions on File Prior to Visit  Medication Sig Dispense Refill  . celecoxib (CELEBREX) 100 MG capsule Take 1 capsule (100 mg total) by mouth 2 (two) times daily. 60 capsule 1  . diphenhydrAMINE (BENADRYL) 25 mg capsule Take 25 mg by mouth at bedtime.    . ergocalciferol (VITAMIN D2) 50000 UNITS capsule Take 1 capsule (50,000 Units total) by mouth once a week. (Patient not taking: Reported on 04/03/2015) 8 capsule 0  . ibuprofen (ADVIL,MOTRIN) 200 MG tablet Take 200 mg by mouth every 6 (six) hours as needed.    . methocarbamol (ROBAXIN) 500 MG tablet Take 1 tablet (500 mg total) by mouth 3 (three) times daily. 60 tablet 0  . Multiple Vitamin (MULTIVITAMIN) tablet Take 1 tablet by mouth daily.    . pantoprazole (PROTONIX) 40 MG tablet Take 1 tablet (40 mg total) by mouth daily. 30 tablet 3  . traMADol (ULTRAM-ER) 200 MG 24 hr tablet Take 1 tablet by mouth as needed.     No current facility-administered medications on file prior to visit.    Past Surgical History  Procedure Laterality Date  . Axillary surgery      Sweat glands removed, bilateral 2007  . Wisdom tooth extraction    . Tubal ligation      1996  .  Cholecystectomy      2002    Allergies  Allergen Reactions  . Anesthetics, Ester Nausea And Vomiting  . Penicillins Hives    Social History   Social History  . Marital Status: Married    Spouse Name: N/A  . Number of Children: N/A  . 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 Smoking status: Never Smoker   . Smokeless tobacco: Never Used  . Alcohol Use: No  . Drug Use: No  . Sexual Activity:    Partners: Male   Other Topics Concern  . Not on file   Social History Narrative    Family History  Problem Relation Age of Onset  . Hypertension Mother     Living  . COPD Mother   . Asthma Mother   . Scoliosis Mother   . Heart disease Father 62    Deceased  . Hyperlipidemia Father   . Lymphoma Father   . COPD Father   . Congestive Heart Failure Father   . Heart attack Father   . Uterine cancer Maternal Grandmother   . Anuerysm Father   . Aneurysm Paternal Grandfather   . Heart disease Paternal Grandfather   . Lung cancer Maternal Grandfather   .  Diabetes Paternal Grandmother   . Hypertension Maternal Grandmother   . COPD Other     Maternal Aunts & Uncles  . Heart disease Other     Maternal Aunts & Uncles  . Aneurysm Other     Paternal uncles x3  . Aneurysm Brother     #1  . Asthma Brother     #1  . Diabetes Brother     #1  . Asthma Brother   . Breast cancer Sister   . Cardiomyopathy Sister   . Arthritis Sister   . Arthritis Father   . Lymphoma Son     Diego CoryGray Cell -- 5 years Remission  . Scoliosis Daughter     x1  . Healthy Son     #2    BP 131/85 mmHg  Pulse 69  Ht 5\' 2"  (1.575 m)  Wt 225 lb (102.059 kg)  BMI 41.14 kg/m2  LMP 03/27/2015  Review of Systems: See HPI above.    Objective:  Physical Exam:  Gen: NAD  Right knee: No gross deformity, ecchymoses, swelling. Medial joint line tenderness.  No other tenderness. FROM. Negative ant/post drawers. Negative  valgus/varus testing. Negative lachmanns. Pain with mcmurrays, apleys.  Negative patellar apprehension. NV intact distally.  Left knee: FROM without pain.    Assessment & Plan:  1. Right knee pain - consistent with DJD vs degenerative medial meniscus tear.  Both treated similarly initially.  Cortisone injection given today.  Tylenol, nsaids, topical medications, glucosamine discussed.  Shown home exercises to do daily.  F/u in 1 month for reevaluation.  COnsider MRI, PT if not improving.  After informed written consent, patient was seated on exam table. Right knee was prepped with alcohol swab and utilizing anteromedial approach, patient's right knee was injected intraarticularly with 3:1 marcaine: depomedrol. Patient tolerated the procedure well without immediate complications.

## 2015-04-13 ENCOUNTER — Encounter: Payer: Self-pay | Admitting: Physician Assistant

## 2015-04-13 ENCOUNTER — Encounter: Payer: Self-pay | Admitting: Family Medicine

## 2015-04-25 ENCOUNTER — Ambulatory Visit: Payer: Managed Care, Other (non HMO) | Admitting: Physician Assistant

## 2015-05-01 ENCOUNTER — Ambulatory Visit: Payer: Managed Care, Other (non HMO) | Admitting: Physician Assistant

## 2015-07-03 ENCOUNTER — Encounter: Payer: Self-pay | Admitting: Physician Assistant

## 2015-07-03 ENCOUNTER — Ambulatory Visit (INDEPENDENT_AMBULATORY_CARE_PROVIDER_SITE_OTHER): Payer: Managed Care, Other (non HMO) | Admitting: Physician Assistant

## 2015-07-03 VITALS — BP 122/92 | HR 81 | Temp 98.0°F | Ht 62.0 in | Wt 230.8 lb

## 2015-07-03 DIAGNOSIS — H6981 Other specified disorders of Eustachian tube, right ear: Secondary | ICD-10-CM | POA: Diagnosis not present

## 2015-07-03 DIAGNOSIS — H6121 Impacted cerumen, right ear: Secondary | ICD-10-CM

## 2015-07-03 DIAGNOSIS — H6120 Impacted cerumen, unspecified ear: Secondary | ICD-10-CM | POA: Insufficient documentation

## 2015-07-03 DIAGNOSIS — H6980 Other specified disorders of Eustachian tube, unspecified ear: Secondary | ICD-10-CM | POA: Insufficient documentation

## 2015-07-03 MED ORDER — FLUTICASONE PROPIONATE 50 MCG/ACT NA SUSP: 2.0000 | Freq: Every day | NASAL | Status: DC

## 2015-07-03 NOTE — Progress Notes (Signed)
Patient presents to clinic today c/o 2 weeks of R ear pressure, popping, tinnitus. Associated symptoms include mild nausea and dizziness x 2 days. Notes some mild ear pain. Denies symptoms L ear. Denies fever, chills or other URI symptoms.   Past Medical History  Diagnosis Date  . History of chicken pox   . Hypertension   . Seasonal allergies   . Environmental allergies   . Hidradenitis   . GERD (gastroesophageal reflux disease)   . Frequent headaches   . UTI (lower urinary tract infection)   . Arthritis     Current Outpatient Prescriptions on File Prior to Visit  Medication Sig Dispense Refill  . diphenhydrAMINE (BENADRYL) 25 mg capsule Take 25 mg by mouth at bedtime. Reported on 07/03/2015    . ibuprofen (ADVIL,MOTRIN) 200 MG tablet Take 200 mg by mouth every 6 (six) hours as needed. Reported on 07/03/2015    . methocarbamol (ROBAXIN) 500 MG tablet Take 1 tablet (500 mg total) by mouth 3 (three) times daily. (Patient not taking: Reported on 07/03/2015) 60 tablet 0  . Multiple Vitamin (MULTIVITAMIN) tablet Take 1 tablet by mouth daily. Reported on 07/03/2015    . traMADol (ULTRAM-ER) 200 MG 24 hr tablet Take 1 tablet by mouth as needed. Reported on 07/03/2015     No current facility-administered medications on file prior to visit.    Allergies  Allergen Reactions  . Anesthetics, Ester Nausea And Vomiting  . Penicillins Hives    Family History  Problem Relation Age of Onset  . Hypertension Mother     Living  . COPD Mother   . Asthma Mother   . Scoliosis Mother   . Heart disease Father [id number]    Deceased  . Hyperlipidemia Father   . Lymphoma Father   . COPD Father   . Congestive Heart Failure Father   . Heart attack Father   . Uterine cancer Maternal Grandmother   . Anuerysm Father   . Aneurysm Paternal Grandfather   . Heart disease Paternal Grandfather   . Lung cancer Maternal Grandfather   . Diabetes Paternal Grandmother   . Hypertension Maternal Grandmother   . COPD  Other     Maternal Aunts & Uncles  . Heart disease Other     Maternal Aunts & Uncles  . Aneurysm Other     Paternal uncles x3  . Aneurysm Brother     #1  . Asthma Brother     #1  . Diabetes Brother     #1  . Asthma Brother   . Breast cancer Sister   . Cardiomyopathy Sister   . Arthritis Sister   . Arthritis Father   . Lymphoma Son     Diego CoryGray Cell -- 5 years Remission  . Scoliosis Daughter     x1  . Healthy Son     #2    Social History   Social History  . Marital Status: Married    Spouse Name: N/A  . Number of Children: N/A  . 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 Smoking status: Never Smoker   . Smokeless tobacco: Never Used  . Alcohol Use: No  . Drug Use: No  . Sexual Activity:    Partners: Male   Other Topics Concern  . None   Social History Narrative   Review of Systems - See HPI.  All other ROS are negative.  BP 122/92 mmHg  Pulse 81  Temp(Src) 98 F (36.7 C) (Oral)  Ht  (1.575 m)  Wt 230 lb 12.8 oz (104.69 kg)  BMI 42.20 kg/m2  SpO2 98%  LMP 06/20/2015  Physical Exam  Constitutional: She is oriented to person, place, and time and well-developed, well-nourished, and in no distress.  HENT:  L TM within normal limits.  R TM with cerumen impaction. After irrigation R TM reveals clear fluid without erythema or bulging.  Eyes: Conjunctivae are normal.  Cardiovascular: Normal rate, regular rhythm, normal heart sounds and intact distal pulses.   Pulmonary/Chest: Effort normal and breath sounds normal. No respiratory distress. She has no wheezes. She has no rales. She exhibits no tenderness.  Neurological: She is alert and oriented to person, place, and time.  Skin: Skin is warm and dry. No rash noted.     Assessment/Plan: Eustachian tube dysfunction Rx Flonase to use daily. Daily Claritin. Follow-up if not resolving.  Cerumen impaction Removed via quick  irrigation.

## 2015-07-03 NOTE — Progress Notes (Signed)
Pre visit review using our clinic review tool, if applicable. No additional management support is needed unless otherwise documented below in the visit note. 

## 2015-07-03 NOTE — Assessment & Plan Note (Signed)
Removed via quick irrigation.

## 2015-07-03 NOTE — Assessment & Plan Note (Signed)
Rx Flonase to use daily. Daily Claritin. Follow-up if not resolving.

## 2015-07-03 NOTE — Patient Instructions (Signed)
Please use Flonase as directed. Take a daily Claritin. Increase fluids.   You will be contacted for an assessment by Nutritionist.

## 2015-07-11 ENCOUNTER — Encounter: Payer: Self-pay | Admitting: Family Medicine

## 2015-07-11 ENCOUNTER — Ambulatory Visit (INDEPENDENT_AMBULATORY_CARE_PROVIDER_SITE_OTHER): Payer: Managed Care, Other (non HMO) | Admitting: Family Medicine

## 2015-07-11 VITALS — BP 138/96 | HR 91 | Ht 62.0 in | Wt 230.0 lb

## 2015-07-11 DIAGNOSIS — M25561 Pain in right knee: Secondary | ICD-10-CM

## 2015-07-11 MED ORDER — METHYLPREDNISOLONE ACETATE 40 MG/ML IJ SUSP
40.0000 mg | Freq: Once | INTRAMUSCULAR | Status: AC
  Administered 2015-07-11: 40 mg via INTRA_ARTICULAR

## 2015-07-11 NOTE — Patient Instructions (Signed)
You have a flare of arthritis and pes bursitis Take tylenol 500mg  1-2 tabs three times a day for pain. Celebrex or your aleve 1-2 tabs twice a day with food Glucosamine sulfate 750mg  twice a day is a supplement that may help. Capsaicin, aspercreme, or biofreeze topically up to four times a day may also help with pain. Cortisone injections are an option - you were given these today. It's important that you continue to stay active. Hacky sack exercises, inside leg raise, straight leg raises, knee extensions 3 sets of 10 once a day (add ankle weight if these become too easy). Consider physical therapy to strengthen muscles around the joint that hurts to take pressure off of the joint itself - let us know if you want to do physical therapy for this if you're not improving as expected. Shoe inserts with good arch support may be helpful. Heat or ice 15 minutes at a time 3-4 times a day as needed to help with pain. Water aerobics and cycling with low resistance are the best two types of exercise for arthritis. Follow up with me as needed otherwise.

## 2015-07-16 NOTE — Assessment & Plan Note (Signed)
consistent with DJD vs degenerative medial meniscus tear and pes bursitis.  Discussed options - went ahead with intraarticular and pes injections today.  Tylenol, nsaids, topical medications, glucosamine discussed.  Shown home exercises to do daily.  She will consider physical therapy if not improving.  F/u prn.   After informed written consent, patient was seated on exam table. Right knee was prepped with alcohol swab and utilizing anteromedial approach, patient's right knee was injected intraarticularly with 3:1 marcaine: depomedrol. Patient tolerated the procedure well without immediate complications.  After informed written consent, patient was seated on exam table. Right knee was prepped over pes bursa with alcohol swab and patient's right pes bursa was injected with 2:1 marcaine: depomedrol. Patient tolerated the procedure well without immediate complications.

## 2015-07-16 NOTE — Progress Notes (Signed)
PCP and consultation requested by Piedad ClimesMartin, William Cody, PA-C  Subjective:   HPI: Patient is a 50 y.o. female here for right knee pain.  12/13: Patient denies known injury or trauma. She reports 3 weeks of gradually worsening right knee pain. 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 Pain level 3/10, up to 8/10 by end of shift. Worse with a lot of walking, steps. Pain radiates to back of knee. Worse with twisting - sharp pain. No skin changes, fever, other complaints.  3/15: Patient returns stating she had done well with right knee up until about 1 1/2 weeks ago. Intraarticular injection from last visit helped. Current pain is anterior and also below the knee medially. Has been taking aleve or celebrex, icing. Pain worse with prolonged walking, dressing, getting out of car. On feet all day at work. Pain level 6/10, throbbing. No skin changes, fever.  Past Medical History  Diagnosis Date  . History of chicken pox   . Hypertension   . Seasonal allergies   . Environmental allergies   . Hidradenitis   . GERD (gastroesophageal reflux disease)   . Frequent headaches   . UTI (lower urinary tract infection)   . Arthritis     Current Outpatient Prescriptions on File Prior to Visit  Medication Sig Dispense Refill  . diphenhydrAMINE (BENADRYL) 25 mg capsule Take 25 mg by mouth at bedtime. Reported on 07/03/2015    . fluticasone (FLONASE) 50 MCG/ACT nasal spray Place 2 sprays into both nostrils daily. 16 g 6  . ibuprofen (ADVIL,MOTRIN) 200 MG tablet Take 200 mg by mouth every 6 (six) hours as needed. Reported on 07/03/2015    . methocarbamol (ROBAXIN) 500 MG tablet Take 1 tablet (500 mg total) by mouth 3 (three) times daily. (Patient not taking: Reported on 07/03/2015) 60 tablet 0  . Multiple Vitamin (MULTIVITAMIN) tablet Take 1 tablet by mouth daily. Reported on 07/03/2015    . traMADol (ULTRAM-ER) 200 MG 24 hr tablet Take 1 tablet by mouth as needed. Reported on 07/03/2015     No current  facility-administered medications on file prior to visit.    Past Surgical History  Procedure Laterality Date  . Axillary surgery      Sweat glands removed, bilateral 2007  . Wisdom tooth extraction    . Tubal ligation      1996  . Cholecystectomy      2002    Allergies  Allergen Reactions  . Anesthetics, Ester Nausea And Vomiting  . Penicillins Hives    Social History   Social History  . Marital Status: Married    Spouse Name: N/A  . Number of Children: N/A  . 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 Smoking status: Never Smoker   . Smokeless tobacco: Never Used  . Alcohol Use: No  . Drug Use: No  . Sexual Activity:    Partners: Male   Other Topics Concern  . Not on file   Social History Narrative    Family History  Problem Relation Age of Onset  . Hypertension Mother     Living  . COPD Mother   . Asthma Mother   . Scoliosis Mother   . Heart disease Father [id number]    Deceased  . Hyperlipidemia Father   . Lymphoma Father   . COPD Father   . Congestive Heart Failure Father   . Heart attack Father   .  Uterine cancer Maternal Grandmother   . Anuerysm Father   . Aneurysm Paternal Grandfather   . Heart disease Paternal Grandfather   . Lung cancer Maternal Grandfather   . Diabetes Paternal Grandmother   . Hypertension Maternal Grandmother   . COPD Other     Maternal Aunts & Uncles  . Heart disease Other     Maternal Aunts & Uncles  . Aneurysm Other     Paternal uncles x3  . Aneurysm Brother     #1  . Asthma Brother     #1  . Diabetes Brother     #1  . Asthma Brother   . Breast cancer Sister   . Cardiomyopathy Sister   . Arthritis Sister   . Arthritis Father   . Lymphoma Son     Diego Cory -- 5 years Remission  . Scoliosis Daughter     x1  . Healthy Son     #2    BP 138/96 mmHg  Pulse 91  Ht  (1.575 m)  Wt 230 lb (104.327 kg)  BMI 42.06 kg/m2  LMP  06/20/2015  Review of Systems: See HPI above.    Objective:  Physical Exam:  Gen: NAD  Right knee: No gross deformity, ecchymoses, swelling. Medial joint line and pes tenderness.  No other tenderness. FROM. Negative ant/post drawers. Negative valgus/varus testing. Negative lachmanns. Negative mcmurrays, apleys.  Negative patellar apprehension. NV intact distally.  Left knee: FROM without pain.    Assessment & Plan:  1. Right knee pain - consistent with DJD vs degenerative medial meniscus tear and pes bursitis.  Discussed options - went ahead with intraarticular and pes injections today.  Tylenol, nsaids, topical medications, glucosamine discussed.  Shown home exercises to do daily.  She will consider physical therapy if not improving.  F/u prn.   After informed written consent, patient was seated on exam table. Right knee was prepped with alcohol swab and utilizing anteromedial approach, patient's right knee was injected intraarticularly with 3:1 marcaine: depomedrol. Patient tolerated the procedure well without immediate complications.  After informed written consent, patient was seated on exam table. Right knee was prepped over pes bursa with alcohol swab and patient's right pes bursa was injected with 2:1 marcaine: depomedrol. Patient tolerated the procedure well without immediate complications.

## 2015-11-27 ENCOUNTER — Ambulatory Visit (INDEPENDENT_AMBULATORY_CARE_PROVIDER_SITE_OTHER): Payer: Managed Care, Other (non HMO) | Admitting: Family Medicine

## 2015-11-27 ENCOUNTER — Ambulatory Visit (HOSPITAL_BASED_OUTPATIENT_CLINIC_OR_DEPARTMENT_OTHER)
Admission: RE | Admit: 2015-11-27 | Discharge: 2015-11-27 | Disposition: A | Discharge status: Home / Self Care | Payer: Managed Care, Other (non HMO) | Source: Ambulatory Visit | Attending: Family Medicine | Admitting: Family Medicine

## 2015-11-27 ENCOUNTER — Encounter: Payer: Self-pay | Admitting: Family Medicine

## 2015-11-27 VITALS — BP 134/81 | HR 62 | Temp 99.1°F | Wt 206.6 lb

## 2015-11-27 DIAGNOSIS — M5410 Radiculopathy, site unspecified: Secondary | ICD-10-CM | POA: Diagnosis not present

## 2015-11-27 MED ORDER — TRAMADOL HCL 50 MG PO TABS
50.0000 mg | ORAL_TABLET | Freq: Three times a day (TID) | ORAL | 0 refills | Status: DC | PRN
Start: 2015-11-27 — End: 2017-11-17

## 2015-11-27 MED ORDER — CYCLOBENZAPRINE HCL 10 MG PO TABS: 10.0000 mg | ORAL_TABLET | Freq: Three times a day (TID) | ORAL | 0 refills | Status: DC | PRN

## 2015-11-27 NOTE — Progress Notes (Signed)
Pre visit review using our clinic review tool, if applicable. No additional management support is needed unless otherwise documented below in the visit note. 

## 2015-11-27 NOTE — Progress Notes (Signed)
Patient ID: Kristin Wong, female    DOB: Apr 14, 1966  Age: 50 y.o. MRN: [id number]    Subjective:  Subjective  HPI Kristin Wong presents for R hip pain and radiates down r leg to foot.  + pain and spasms -- 6-10/10 depending on time of day.  advil taken with some relief.  + heat/ cold -- no relief  Sitting walking and laying down is very painful.  She has had minor things like this before but it usually went away in a few days.  This is not getting better---x 10-14 days ago.      Review of Systems  Constitutional: Negative for appetite change, diaphoresis, fatigue and unexpected weight change.  Eyes: Negative for pain, redness and visual disturbance.  Respiratory: Negative for cough, chest tightness, shortness of breath and wheezing.   Cardiovascular: Negative for chest pain, palpitations and leg swelling.  Endocrine: Negative for cold intolerance, heat intolerance, polydipsia, polyphagia and polyuria.  Genitourinary: Negative for difficulty urinating, dysuria and frequency.  Musculoskeletal: Positive for back pain. Negative for gait problem.  Neurological: Positive for weakness. Negative for dizziness, light-headedness, numbness and headaches.    History Past Medical History:  Diagnosis Date  . Arthritis   . Environmental allergies   . Frequent headaches   . GERD (gastroesophageal reflux disease)   . Hidradenitis   . History of chicken pox   . Hypertension   . Seasonal allergies   . UTI (lower urinary tract infection)     She has a past surgical history that includes Axillary Surgery; Wisdom tooth extraction; Tubal ligation; and Cholecystectomy.   Her family history includes Aneurysm in her brother, other, and paternal grandfather; Anuerysm in her father; Arthritis in her father and sister; Asthma in her brother, brother, and mother; Breast cancer in her sister; COPD in her father, mother, and other; Cardiomyopathy in her sister; Congestive Heart Failure in her father; Diabetes in  her brother and paternal grandmother; Healthy in her son; Heart attack in her father; Heart disease in her other and paternal grandfather; Heart disease (age of onset: 75) in her father; Hyperlipidemia in her father; Hypertension in her maternal grandmother and mother; Lung cancer in her maternal grandfather; Lymphoma in her father and son; Scoliosis in her daughter and mother; Uterine cancer in her maternal grandmother.She reports that she has never smoked. She has never used smokeless tobacco. She reports that she does not drink alcohol or use drugs.  Current Outpatient Prescriptions on File Prior to Visit  Medication Sig Dispense Refill  . diphenhydrAMINE (BENADRYL) 25 mg capsule Take 25 mg by mouth at bedtime. Reported on 07/03/2015    . ibuprofen (ADVIL,MOTRIN) 200 MG tablet Take 200 mg by mouth every 6 (six) hours as needed. Reported on 07/03/2015    . Multiple Vitamin (MULTIVITAMIN) tablet Take 1 tablet by mouth daily. Reported on 07/03/2015     No current facility-administered medications on file prior to visit.      Objective:  Objective  Physical Exam  Constitutional: She is oriented to person, place, and time. She appears well-developed and well-nourished.  HENT:  Head: Normocephalic and atraumatic.  Eyes: Conjunctivae and EOM are normal.  Neck: Normal range of motion. Neck supple. No JVD present. Carotid bruit is not present. No thyromegaly present.  Cardiovascular: Normal rate, regular rhythm and normal heart sounds.   No murmur heard. Pulmonary/Chest: Effort normal and breath sounds normal. No respiratory distress. She has no wheezes. She has no rales. She exhibits no tenderness.  Musculoskeletal:  She exhibits no edema.       Lumbar back: She exhibits tenderness, pain and spasm.       Right upper leg: She exhibits tenderness.       Right lower leg: She exhibits tenderness.       Right foot: There is decreased range of motion and tenderness.  Neurological: She is alert and oriented  to person, place, and time.  Weakness in r low leg and foot --- hip flexion normal   Psychiatric: She has a normal mood and affect.  Nursing note and vitals reviewed.  BP 134/81 (BP Location: Left Arm, Patient Position: Sitting, Cuff Size: Normal)   Pulse 62   Temp 99.1 F (37.3 C) (Oral)   Wt 206 lb 9.6 oz (93.7 kg)   LMP 11/05/2015   SpO2 100%   BMI 37.79 kg/m  Wt Readings from Last 3 Encounters:  11/27/15 206 lb 9.6 oz (93.7 kg)  07/11/15 230 lb (104.3 kg)  07/03/15 230 lb 12.8 oz (104.7 kg)     Lab Results  Component Value Date   WBC 6.9 12/25/2014   HGB 12.8 12/25/2014   HCT 37.8 12/25/2014   PLT 316.0 12/25/2014   GLUCOSE 95 12/25/2014   NA 138 12/25/2014   K 4.4 12/25/2014   CL 106 12/25/2014   CREATININE 0.75 12/25/2014   BUN 15 12/25/2014   CO2 24 12/25/2014   TSH 1.94 12/25/2014    Dg Knee Complete 4 Views Right  Result Date: 04/09/2015 CLINICAL DATA:  Knee pain.  Initial evaluation. EXAM: RIGHT KNEE - COMPLETE 4+ VIEW COMPARISON:  No prior . FINDINGS: No acute bony or joint abnormality identified. No evidence of fracture dislocation. Mild patellofemoral medial compartment degenerative change. IMPRESSION: No acute abnormality. Mild patellofemoral and medial compartment degenerative change. Electronically Signed   By: Maisie Fus  Register   On: 04/09/2015 11:21     Assessment & Plan:  Plan  I have discontinued Ms. Doerner's traMADol, methocarbamol, and fluticasone. I am also having her start on cyclobenzaprine and traMADol. Additionally, I am having her maintain her ibuprofen, diphenhydrAMINE, multivitamin, and NATURE-THROID.  Meds ordered this encounter  Medications  . NATURE-THROID 81.25 MG TABS    Sig: Take 1 tablet by mouth daily.  . cyclobenzaprine (FLEXERIL) 10 MG tablet    Sig: Take 1 tablet (10 mg total) by mouth 3 (three) times daily as needed for muscle spasms.    Dispense:  30 tablet    Refill:  0  . traMADol (ULTRAM) 50 MG tablet    Sig: Take 1  tablet (50 mg total) by mouth every 8 (eight) hours as needed.    Dispense:  30 tablet    Refill:  0    Problem List Items Addressed This Visit    None    Visit Diagnoses    Acute low back pain with radicular symptoms, duration less than 6 weeks    -  Primary   Relevant Medications   cyclobenzaprine (FLEXERIL) 10 MG tablet   traMADol (ULTRAM) 50 MG tablet     Alt ice and heat prn ,   Rest ,  If no better in 2 weeks rto  Follow-up: No Follow-up on file.  Donato Schultz, DO

## 2015-11-27 NOTE — Patient Instructions (Signed)

## 2015-12-04 ENCOUNTER — Encounter: Payer: Self-pay | Admitting: Physician Assistant

## 2015-12-04 ENCOUNTER — Ambulatory Visit (INDEPENDENT_AMBULATORY_CARE_PROVIDER_SITE_OTHER): Payer: Managed Care, Other (non HMO) | Admitting: Physician Assistant

## 2015-12-04 VITALS — BP 110/78 | HR 79 | Temp 98.1°F | Resp 16 | Ht 62.0 in | Wt 204.1 lb

## 2015-12-04 DIAGNOSIS — G8929 Other chronic pain: Secondary | ICD-10-CM | POA: Diagnosis not present

## 2015-12-04 DIAGNOSIS — M5431 Sciatica, right side: Secondary | ICD-10-CM

## 2015-12-04 DIAGNOSIS — M25561 Pain in right knee: Secondary | ICD-10-CM | POA: Diagnosis not present

## 2015-12-04 MED ORDER — METHYLPREDNISOLONE 4 MG PO TBPK: ORAL_TABLET | ORAL | 0 refills | Status: DC

## 2015-12-04 NOTE — Patient Instructions (Addendum)
Please start the steroid pack. Continue other medications as directed.  Follow the stretching exercises below. Let me know if things are not improving.  For your knee, I am ordering an MRI giving chronic pain. You will be contacted to schedule.     Sciatica With Rehab The sciatic nerve runs from the back down the leg and is responsible for sensation and control of the muscles in the back (posterior) side of the thigh, lower leg, and foot. Sciatica is a condition that is characterized by inflammation of this nerve.  SYMPTOMS   Signs of nerve damage, including numbness and/or weakness along the posterior side of the lower extremity.  Pain in the back of the thigh that may also travel down the leg.  Pain that worsens when sitting for long periods of time.  Occasionally, pain in the back or buttock. CAUSES  Inflammation of the sciatic nerve is the cause of sciatica. The inflammation is due to something irritating the nerve. Common sources of irritation include:  Sitting for long periods of time.  Direct trauma to the nerve.  Arthritis of the spine.  Herniated or ruptured disk.  Slipping of the vertebrae (spondylolisthesis).  Pressure from soft tissues, such as muscles or ligament-like tissue (fascia). RISK INCREASES WITH:  Sports that place pressure or stress on the spine (football or weightlifting).  Poor strength and flexibility.  Failure to warm up properly before activity.  Family history of low back pain or disk disorders.  Previous back injury or surgery.  Poor body mechanics, especially when lifting, or poor posture. PREVENTION   Warm up and stretch properly before activity.  Maintain physical fitness:  Strength, flexibility, and endurance.  Cardiovascular fitness.  Learn and use proper technique, especially with posture and lifting. When possible, have coach correct improper technique.  Avoid activities that place stress on the spine. PROGNOSIS If  treated properly, then sciatica usually resolves within 6 weeks. However, occasionally surgery is necessary.  RELATED COMPLICATIONS   Permanent nerve damage, including pain, numbness, tingle, or weakness.  Chronic back pain.  Risks of surgery: infection, bleeding, nerve damage, or damage to surrounding tissues. TREATMENT Treatment initially involves resting from any activities that aggravate your symptoms. The use of ice and medication may help reduce pain and inflammation. The use of strengthening and stretching exercises may help reduce pain with activity. These exercises may be performed at home or with referral to a therapist. A therapist may recommend further treatments, such as transcutaneous electronic nerve stimulation (TENS) or ultrasound. Your caregiver may recommend corticosteroid injections to help reduce inflammation of the sciatic nerve. If symptoms persist despite non-surgical (conservative) treatment, then surgery may be recommended. MEDICATION  If pain medication is necessary, then nonsteroidal anti-inflammatory medications, such as aspirin and ibuprofen, or other minor pain relievers, such as acetaminophen, are often recommended.  Do not take pain medication for 7 days before surgery.  Prescription pain relievers may be given if deemed necessary by your caregiver. Use only as directed and only as much as you need.  Ointments applied to the skin may be helpful.  Corticosteroid injections may be given by your caregiver. These injections should be reserved for the most serious cases, because they may only be given a certain number of times. HEAT AND COLD  Cold treatment (icing) relieves pain and reduces inflammation. Cold treatment should be applied for 10 to 15 minutes every 2 to 3 hours for inflammation and pain and immediately after any activity that aggravates your symptoms. Use ice  packs or massage the area with a piece of ice (ice massage).  Heat treatment may be used  prior to performing the stretching and strengthening activities prescribed by your caregiver, physical therapist, or athletic trainer. Use a heat pack or soak the injury in warm water. SEEK MEDICAL CARE IF:  Treatment seems to offer no benefit, or the condition worsens.  Any medications produce adverse side effects. EXERCISES  RANGE OF MOTION (ROM) AND STRETCHING EXERCISES - Sciatica Most people with sciatic will find that their symptoms worsen with either excessive bending forward (flexion) or arching at the low back (extension). The exercises which will help resolve your symptoms will focus on the opposite motion. Your physician, physical therapist or athletic trainer will help you determine which exercises will be most helpful to resolve your low back pain. Do not complete any exercises without first consulting with your clinician. Discontinue any exercises which worsen your symptoms until you speak to your clinician. If you have pain, numbness or tingling which travels down into your buttocks, leg or foot, the goal of the therapy is for these symptoms to move closer to your back and eventually resolve. Occasionally, these leg symptoms will get better, but your low back pain may worsen; this is typically an indication of progress in your rehabilitation. Be certain to be very alert to any changes in your symptoms and the activities in which you participated in the 24 hours prior to the change. Sharing this information with your clinician will allow him/her to most efficiently treat your condition. These exercises may help you when beginning to rehabilitate your injury. Your symptoms may resolve with or without further involvement from your physician, physical therapist or athletic trainer. While completing these exercises, remember:   Restoring tissue flexibility helps normal motion to return to the joints. This allows healthier, less painful movement and activity.  An effective stretch should be held  for at least 30 seconds.  A stretch should never be painful. You should only feel a gentle lengthening or release in the stretched tissue. FLEXION RANGE OF MOTION AND STRETCHING EXERCISES: STRETCH - Flexion, Single Knee to Chest   Lie on a firm bed or floor with both legs extended in front of you.  Keeping one leg in contact with the floor, bring your opposite knee to your chest. Hold your leg in place by either grabbing behind your thigh or at your knee.  Pull until you feel a gentle stretch in your low back. Hold __________ seconds.  Slowly release your grasp and repeat the exercise with the opposite side. Repeat __________ times. Complete this exercise __________ times per day.  STRETCH - Flexion, Double Knee to Chest  Lie on a firm bed or floor with both legs extended in front of you.  Keeping one leg in contact with the floor, bring your opposite knee to your chest.  Tense your stomach muscles to support your back and then lift your other knee to your chest. Hold your legs in place by either grabbing behind your thighs or at your knees.  Pull both knees toward your chest until you feel a gentle stretch in your low back. Hold __________ seconds.  Tense your stomach muscles and slowly return one leg at a time to the floor. Repeat __________ times. Complete this exercise __________ times per day.  STRETCH - Low Trunk Rotation   Lie on a firm bed or floor. Keeping your legs in front of you, bend your knees so they are both pointed  toward the ceiling and your feet are flat on the floor.  Extend your arms out to the side. This will stabilize your upper body by keeping your shoulders in contact with the floor.  Gently and slowly drop both knees together to one side until you feel a gentle stretch in your low back. Hold for __________ seconds.  Tense your stomach muscles to support your low back as you bring your knees back to the starting position. Repeat the exercise to the other  side. Repeat __________ times. Complete this exercise __________ times per day  EXTENSION RANGE OF MOTION AND FLEXIBILITY EXERCISES: STRETCH - Extension, Prone on Elbows  Lie on your stomach on the floor, a bed will be too soft. Place your palms about shoulder width apart and at the height of your head.  Place your elbows under your shoulders. If this is too painful, stack pillows under your chest.  Allow your body to relax so that your hips drop lower and make contact more completely with the floor.  Hold this position for __________ seconds.  Slowly return to lying flat on the floor. Repeat __________ times. Complete this exercise __________ times per day.  RANGE OF MOTION - Extension, Prone Press Ups  Lie on your stomach on the floor, a bed will be too soft. Place your palms about shoulder width apart and at the height of your head.  Keeping your back as relaxed as possible, slowly straighten your elbows while keeping your hips on the floor. You may adjust the placement of your hands to maximize your comfort. As you gain motion, your hands will come more underneath your shoulders.  Hold this position __________ seconds.  Slowly return to lying flat on the floor. Repeat __________ times. Complete this exercise __________ times per day.  STRENGTHENING EXERCISES - Sciatica  These exercises may help you when beginning to rehabilitate your injury. These exercises should be done near your "sweet spot." This is the neutral, low-back arch, somewhere between fully rounded and fully arched, that is your least painful position. When performed in this safe range of motion, these exercises can be used for people who have either a flexion or extension based injury. These exercises may resolve your symptoms with or without further involvement from your physician, physical therapist or athletic trainer. While completing these exercises, remember:   Muscles can gain both the endurance and the strength  needed for everyday activities through controlled exercises.  Complete these exercises as instructed by your physician, physical therapist or athletic trainer. Progress with the resistance and repetition exercises only as your caregiver advises.  You may experience muscle soreness or fatigue, but the pain or discomfort you are trying to eliminate should never worsen during these exercises. If this pain does worsen, stop and make certain you are following the directions exactly. If the pain is still present after adjustments, discontinue the exercise until you can discuss the trouble with your clinician. STRENGTHENING - Deep Abdominals, Pelvic Tilt   Lie on a firm bed or floor. Keeping your legs in front of you, bend your knees so they are both pointed toward the ceiling and your feet are flat on the floor.  Tense your lower abdominal muscles to press your low back into the floor. This motion will rotate your pelvis so that your tail bone is scooping upwards rather than pointing at your feet or into the floor.  With a gentle tension and even breathing, hold this position for __________ seconds. Repeat __________ times.  Complete this exercise __________ times per day.  STRENGTHENING - Abdominals, Crunches   Lie on a firm bed or floor. Keeping your legs in front of you, bend your knees so they are both pointed toward the ceiling and your feet are flat on the floor. Cross your arms over your chest.  Slightly tip your chin down without bending your neck.  Tense your abdominals and slowly lift your trunk high enough to just clear your shoulder blades. Lifting higher can put excessive stress on the low back and does not further strengthen your abdominal muscles.  Control your return to the starting position. Repeat __________ times. Complete this exercise __________ times per day.  STRENGTHENING - Quadruped, Opposite UE/LE Lift  Assume a hands and knees position on a firm surface. Keep your hands  under your shoulders and your knees under your hips. You may place padding under your knees for comfort.  Find your neutral spine and gently tense your abdominal muscles so that you can maintain this position. Your shoulders and hips should form a rectangle that is parallel with the floor and is not twisted.  Keeping your trunk steady, lift your right hand no higher than your shoulder and then your left leg no higher than your hip. Make sure you are not holding your breath. Hold this position __________ seconds.  Continuing to keep your abdominal muscles tense and your back steady, slowly return to your starting position. Repeat with the opposite arm and leg. Repeat __________ times. Complete this exercise __________ times per day.  STRENGTHENING - Abdominals and Quadriceps, Straight Leg Raise   Lie on a firm bed or floor with both legs extended in front of you.  Keeping one leg in contact with the floor, bend the other knee so that your foot can rest flat on the floor.  Find your neutral spine, and tense your abdominal muscles to maintain your spinal position throughout the exercise.  Slowly lift your straight leg off the floor about 6 inches for a count of 15, making sure to not hold your breath.  Still keeping your neutral spine, slowly lower your leg all the way to the floor. Repeat this exercise with each leg __________ times. Complete this exercise __________ times per day. POSTURE AND BODY MECHANICS CONSIDERATIONS - Sciatica Keeping correct posture when sitting, standing or completing your activities will reduce the stress put on different body tissues, allowing injured tissues a chance to heal and limiting painful experiences. The following are general guidelines for improved posture. Your physician or physical therapist will provide you with any instructions specific to your needs. While reading these guidelines, remember:  The exercises prescribed by your provider will help you have  the flexibility and strength to maintain correct postures.  The correct posture provides the optimal environment for your joints to work. All of your joints have less wear and tear when properly supported by a spine with good posture. This means you will experience a healthier, less painful body.  Correct posture must be practiced with all of your activities, especially prolonged sitting and standing. Correct posture is as important when doing repetitive low-stress activities (typing) as it is when doing a single heavy-load activity (lifting). RESTING POSITIONS Consider which positions are most painful for you when choosing a resting position. If you have pain with flexion-based activities (sitting, bending, stooping, squatting), choose a position that allows you to rest in a less flexed posture. You would want to avoid curling into a fetal position on your  side. If your pain worsens with extension-based activities (prolonged standing, working overhead), avoid resting in an extended position such as sleeping on your stomach. Most people will find more comfort when they rest with their spine in a more neutral position, neither too rounded nor too arched. Lying on a non-sagging bed on your side with a pillow between your knees, or on your back with a pillow under your knees will often provide some relief. Keep in mind, being in any one position for a prolonged period of time, no matter how correct your posture, can still lead to stiffness. PROPER SITTING POSTURE In order to minimize stress and discomfort on your spine, you must sit with correct posture Sitting with good posture should be effortless for a healthy body. Returning to good posture is a gradual process. Many people can work toward this most comfortably by using various supports until they have the flexibility and strength to maintain this posture on their own. When sitting with proper posture, your ears will fall over your shoulders and your  shoulders will fall over your hips. You should use the back of the chair to support your upper back. Your low back will be in a neutral position, just slightly arched. You may place a small pillow or folded towel at the base of your low back for support.  When working at a desk, create an environment that supports good, upright posture. Without extra support, muscles fatigue and lead to excessive strain on joints and other tissues. Keep these recommendations in mind: CHAIR:   A chair should be able to slide under your desk when your back makes contact with the back of the chair. This allows you to work closely.  The chair's height should allow your eyes to be level with the upper part of your monitor and your hands to be slightly lower than your elbows. BODY POSITION  Your feet should make contact with the floor. If this is not possible, use a foot rest.  Keep your ears over your shoulders. This will reduce stress on your neck and low back. INCORRECT SITTING POSTURES   If you are feeling tired and unable to assume a healthy sitting posture, do not slouch or slump. This puts excessive strain on your back tissues, causing more damage and pain. Healthier options include:  Using more support, like a lumbar pillow.  Switching tasks to something that requires you to be upright or walking.  Talking a brief walk.  Lying down to rest in a neutral-spine position. PROLONGED STANDING WHILE SLIGHTLY LEANING FORWARD  When completing a task that requires you to lean forward while standing in one place for a long time, place either foot up on a stationary 2-4 inch high object to help maintain the best posture. When both feet are on the ground, the low back tends to lose its slight inward curve. If this curve flattens (or becomes too large), then the back and your other joints will experience too much stress, fatigue more quickly and can cause pain.  CORRECT STANDING POSTURES Proper standing posture should  be assumed with all daily activities, even if they only take a few moments, like when brushing your teeth. As in sitting, your ears should fall over your shoulders and your shoulders should fall over your hips. You should keep a slight tension in your abdominal muscles to brace your spine. Your tailbone should point down to the ground, not behind your body, resulting in an over-extended swayback posture.  INCORRECT STANDING  POSTURES  Common incorrect standing postures include a forward head, locked knees and/or an excessive swayback. WALKING Walk with an upright posture. Your ears, shoulders and hips should all line-up. PROLONGED ACTIVITY IN A FLEXED POSITION When completing a task that requires you to bend forward at your waist or lean over a low surface, try to find a way to stabilize 3 of 4 of your limbs. You can place a hand or elbow on your thigh or rest a knee on the surface you are reaching across. This will provide you more stability so that your muscles do not fatigue as quickly. By keeping your knees relaxed, or slightly bent, you will also reduce stress across your low back. CORRECT LIFTING TECHNIQUES DO :   Assume a wide stance. This will provide you more stability and the opportunity to get as close as possible to the object which you are lifting.  Tense your abdominals to brace your spine; then bend at the knees and hips. Keeping your back locked in a neutral-spine position, lift using your leg muscles. Lift with your legs, keeping your back straight.  Test the weight of unknown objects before attempting to lift them.  Try to keep your elbows locked down at your sides in order get the best strength from your shoulders when carrying an object.  Always ask for help when lifting heavy or awkward objects. INCORRECT LIFTING TECHNIQUES DO NOT:   Lock your knees when lifting, even if it is a small object.  Bend and twist. Pivot at your feet or move your feet when needing to change  directions.  Assume that you cannot safely pick up a paperclip without proper posture.   This information is not intended to replace advice given to you by your health care provider. Make sure you discuss any questions you have with your health care provider.   Document Released: 04/14/2005 Document Revised: 08/29/2014 Document Reviewed: 07/27/2008 [employer] Patient Education [employer]

## 2015-12-04 NOTE — Progress Notes (Signed)
Patient presents to clinic today c/o 2-1/2 weeks of right sided low back pain radiating into the hip and down lower extremity. Patient denies trauma or injury. Patient denies numbness, tingling or weakness. Denies saddle anesthesia or change to bowel or bladder habits. Patient was evaluated by another provider last week and started on a combination of tramadol and Flexeril with little relief of symptoms patient denies any new or worsening symptoms, stating the symptoms are just persisting.  Patient also with history of chronic right knee pain, followed by sports medicine. Endorses having multiple cortisone injections in the knee previously without improvement in symptoms. Endorses daily pain. Notes her knee feels as if it wants to buckle out from under her sometimes. Denies having MRI of the knee.   Past Medical History:  Diagnosis Date  . Arthritis   . Environmental allergies   . Frequent headaches   . GERD (gastroesophageal reflux disease)   . Hidradenitis   . History of chicken pox   . Hypertension   . Seasonal allergies   . UTI (lower urinary tract infection)     Current Outpatient Prescriptions on File Prior to Visit  Medication Sig Dispense Refill  . cyclobenzaprine (FLEXERIL) 10 MG tablet Take 1 tablet (10 mg total) by mouth 3 (three) times daily as needed for muscle spasms. 30 tablet 0  . diphenhydrAMINE (BENADRYL) 25 mg capsule Take 25 mg by mouth at bedtime. Reported on 07/03/2015    . ibuprofen (ADVIL,MOTRIN) 200 MG tablet Take 200 mg by mouth every 6 (six) hours as needed. Reported on 07/03/2015    . Multiple Vitamin (MULTIVITAMIN) tablet Take 1 tablet by mouth daily. Reported on 07/03/2015    . NATURE-THROID 81.25 MG TABS Take 1 tablet by mouth daily.    . traMADol (ULTRAM) 50 MG tablet Take 1 tablet (50 mg total) by mouth every 8 (eight) hours as needed. 30 tablet 0   No current facility-administered medications on file prior to visit.     Allergies  Allergen Reactions  .  Anesthetics, Ester Nausea And Vomiting  . Penicillins Hives    Family History  Problem Relation Age of Onset  . Hypertension Mother     Living  . COPD Mother   . Asthma Mother   . Scoliosis Mother   . Heart disease Father 57    Deceased  . Hyperlipidemia Father   . Lymphoma Father   . COPD Father   . Congestive Heart Failure Father   . Heart attack Father   . Anuerysm Father   . Arthritis Father   . Uterine cancer Maternal Grandmother   . Hypertension Maternal Grandmother   . Aneurysm Paternal Grandfather   . Heart disease Paternal Grandfather   . Lung cancer Maternal Grandfather   . Diabetes Paternal Grandmother   . COPD Other     Maternal Aunts & Uncles  . Heart disease Other     Maternal Aunts & Uncles  . Aneurysm Other     Paternal uncles x3  . Aneurysm Brother     #1  . Asthma Brother     #1  . Diabetes Brother     #1  . Asthma Brother   . Breast cancer Sister   . Cardiomyopathy Sister   . Arthritis Sister   . Lymphoma Son     Diego Cory -- 5 years Remission  . Scoliosis Daughter     x1  . Healthy Son     #2  Social History   Social History  . Marital status: Married    Spouse name: N/A  . Number of children: N/A  . Years of education: N/A   Occupational History  . 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 Smoking status: Never Smoker  . Smokeless tobacco: Never Used  . Alcohol use No  . Drug use: No  . Sexual activity: Yes    Partners: Male   Other Topics Concern  . None   Social History Narrative  . None    Review of Systems - See HPI.  All other ROS are negative.  BP 110/78 (BP Location: Right Arm, Patient Position: Sitting, Cuff Size: Large)   Pulse 79   Temp 98.1 F (36.7 C) (Oral)   Resp 16   Ht 5\' 2"  (1.575 m)   Wt 204 lb 2 oz (92.6 kg)   LMP 12/03/2015   SpO2 99%   BMI 37.33 kg/m   Physical Exam  Constitutional: She is oriented to person, place, and time and well-developed, well-nourished,  and in no distress.  HENT:  Head: Normocephalic and atraumatic.  Eyes: Conjunctivae are normal.  Neck: Neck supple.  Cardiovascular: Normal rate, regular rhythm, normal heart sounds and intact distal pulses.   Pulmonary/Chest: Effort normal and breath sounds normal. No respiratory distress. She has no wheezes. She has no rales. She exhibits no tenderness.  Musculoskeletal:       Right knee: She exhibits normal range of motion, no LCL laxity, normal patellar mobility, no bony tenderness and no MCL laxity. No tenderness found.       Thoracic back: Normal.       Lumbar back: She exhibits pain. She exhibits normal range of motion, no tenderness and no bony tenderness.  Neurological: She is alert and oriented to person, place, and time.  Skin: Skin is warm. No rash noted.  Psychiatric: Affect normal.  Vitals reviewed.   No results found for this or any previous visit (from the past 2160 hour(s)).  Assessment/Plan: 1. Sciatica of right side We'll begin Medrol Dosepak. Rehabilitation exercises for sciatica discussed with patient. Handout given. Continue tramadol Flexeril. Follow-up if not improving. - methylPREDNISolone (MEDROL DOSEPAK) 4 MG TBPK tablet; Take following package directions  Dispense: 21 tablet; Refill: 0  2. Chronic knee pain, right We'll obtain MRI to further assess.  - MR Knee Right Wo Contrast; Future    Piedad ClimesMartin, Kholton Coate Cody, PA-C

## 2017-09-16 ENCOUNTER — Encounter: Payer: Self-pay | Admitting: Emergency Medicine

## 2017-11-17 ENCOUNTER — Telehealth: Payer: Self-pay | Admitting: Physician Assistant

## 2017-11-17 ENCOUNTER — Encounter: Payer: Self-pay | Admitting: Family Medicine

## 2017-11-17 ENCOUNTER — Ambulatory Visit (INDEPENDENT_AMBULATORY_CARE_PROVIDER_SITE_OTHER): Payer: 59 | Admitting: Family Medicine

## 2017-11-17 VITALS — BP 150/106 | HR 79 | Temp 98.5°F | Resp 16 | Ht 62.0 in | Wt 243.0 lb

## 2017-11-17 DIAGNOSIS — J0140 Acute pansinusitis, unspecified: Secondary | ICD-10-CM | POA: Diagnosis not present

## 2017-11-17 MED ORDER — SULFAMETHOXAZOLE-TRIMETHOPRIM 800-160 MG PO TABS: 1.0000 | ORAL_TABLET | Freq: Two times a day (BID) | ORAL | 0 refills | Status: DC

## 2017-11-17 MED FILL — SULFAMETHOXAZOLE-TMP DS TAB: 800-160 | 10 days supply | Qty: 20 | Fill #0

## 2017-11-17 NOTE — Patient Instructions (Signed)

## 2017-11-17 NOTE — Telephone Encounter (Signed)
Can switch to me but don't make official switch until she comes into see me. Would recommend she get scheduled for new visit/cpe. Advise she can come in fasting for 8 hours(early morning appointment 8 am). Nothing to eat past midnight so we can check cholesterol and labs. CPE can be done provided that she does not have acute illness going on at same time. Please explain that to her. Advise staff don't change her in epic to me unless comes in to see me.

## 2017-11-17 NOTE — Telephone Encounter (Signed)
Patient is requesting a transfer of care, [facility] Dr. Daph[facility] DeutscherMartin [facility] because of location. Would this be ok with the providers?

## 2017-11-17 NOTE — Progress Notes (Signed)
Patient ID: Kristin Wong, female    DOB: 1965/09/04  Age: 52 y.o. MRN: [id number]    Subjective:  Subjective  HPI Kristin Wong presents for sinus problem x 4 weeks.  Sinus pressure/ ha,  R ear is stopped up,  Started out with st and fever 101-102.  Symptoms progressively worsening over the 4 weeks.  Pt has tried tylenol cold and flu, sudafed and afrin.    Review of Systems  Constitutional: Positive for chills and fever.  HENT: Positive for congestion, ear pain, postnasal drip, rhinorrhea, sinus pressure, sinus pain and sore throat.   Respiratory: Positive for cough, chest tightness, shortness of breath and wheezing.   Cardiovascular: Negative for chest pain, palpitations and leg swelling.  Allergic/Immunologic: Negative for environmental allergies.    History Past Medical History:  Diagnosis Date  . Arthritis   . Environmental allergies   . Frequent headaches   . GERD (gastroesophageal reflux disease)   . Hidradenitis   . History of chicken pox   . Hypertension   . Seasonal allergies   . UTI (lower urinary tract infection)     She has a past surgical history that includes Axillary Surgery; Wisdom tooth extraction; Tubal ligation; and Cholecystectomy.   Her family history includes Aneurysm in her brother, other, and paternal grandfather; Anuerysm in her father; Arthritis in her father and sister; Asthma in her brother, brother, and mother; Breast cancer in her sister; COPD in her father, mother, and other; Cardiomyopathy in her sister; Congestive Heart Failure in her father; Diabetes in her brother and paternal grandmother; Healthy in her son; Heart attack in her father; Heart disease in her other and paternal grandfather; Heart disease (age of onset: 4281) in her father; Hyperlipidemia in her father; Hypertension in her maternal grandmother and mother; Lung cancer in her maternal grandfather; Lymphoma in her father and son; Scoliosis in her daughter and mother; Uterine cancer in her  maternal grandmother.She reports that she has never smoked. She has never used smokeless tobacco. She reports that she does not drink alcohol or use drugs.  Current Outpatient Medications on File Prior to Visit  Medication Sig Dispense Refill  . diphenhydrAMINE (BENADRYL) 25 mg capsule Take 25 mg by mouth at bedtime. Reported on 07/03/2015    . Multiple Vitamin (MULTIVITAMIN) tablet Take 1 tablet by mouth daily. Reported on 07/03/2015     No current facility-administered medications on file prior to visit.      Objective:  Objective  Physical Exam  Constitutional: She is oriented to person, place, and time. She appears well-developed and well-nourished.  HENT:  Right Ear: External ear normal.  Left Ear: External ear normal.  + PND + errythema  Eyes: Conjunctivae are normal. Right eye exhibits no discharge. Left eye exhibits no discharge.  Cardiovascular: Normal rate, regular rhythm and normal heart sounds.  No murmur heard. Pulmonary/Chest: Effort normal and breath sounds normal. No respiratory distress. She has no wheezes. She has no rales. She exhibits no tenderness.  Musculoskeletal: She exhibits no edema.  Lymphadenopathy:    She has cervical adenopathy.  Neurological: She is alert and oriented to person, place, and time.  Nursing note and vitals reviewed.  BP (!) 160/100 (BP Location: Left Arm, Patient Position: Sitting, Cuff Size: Large)   Pulse 79   Temp 98.5 F (36.9 C) (Oral)   Resp 16   Ht 5\' 2"  (1.575 m)   Wt 243 lb (110.2 kg)   LMP 11/07/2017   BMI 44.45 kg/m  Wt Readings  from Last 3 Encounters:  11/17/17 243 lb (110.2 kg)  12/04/15 204 lb 2 oz (92.6 kg)  11/27/15 206 lb 9.6 oz (93.7 kg)     Lab Results  Component Value Date   WBC 6.9 12/25/2014   HGB 12.8 12/25/2014   HCT 37.8 12/25/2014   PLT 316.0 12/25/2014   GLUCOSE 95 12/25/2014   NA 138 12/25/2014   K 4.4 12/25/2014   CL 106 12/25/2014   CREATININE 0.75 12/25/2014   BUN 15 12/25/2014   CO2 24  12/25/2014   TSH 1.94 12/25/2014    Dg Lumbar Spine Complete  Result Date: 11/27/2015 CLINICAL DATA:  Low back pain w/numbness, tingling pain in rt leg x1wk. EXAM: LUMBAR SPINE - COMPLETE 4+ VIEW COMPARISON:  None. FINDINGS: There is no evidence of lumbar spine fracture. Alignment is normal. Intervertebral disc spaces are maintained. IMPRESSION: Negative. Electronically Signed   By: Amie Portland M.D.   On: 11/27/2015 13:03     Assessment & Plan:  Plan  I have discontinued Kristin Wong's ibuprofen, NATURE-THROID, cyclobenzaprine, traMADol, and methylPREDNISolone. I am also having her start on sulfamethoxazole-trimethoprim. Additionally, I am having her maintain her diphenhydrAMINE and multivitamin.  Meds ordered this encounter  Medications  . sulfamethoxazole-trimethoprim (BACTRIM DS,SEPTRA DS) 800-160 MG tablet    Sig: Take 1 tablet by mouth 2 (two) times daily.    Dispense:  20 tablet    Refill:  0    Problem List Items Addressed This Visit      Unprioritized   Acute pansinusitis - Primary    abx x 10 days  Use flonase and antihistamines Stay away from decongestants  rto prn       Relevant Medications   sulfamethoxazole-trimethoprim (BACTRIM DS,SEPTRA DS) 800-160 MG tablet      Follow-up: Return in about 2 weeks (around 12/01/2017), or if symptoms worsen or fail to improve, for with pcp for bp check.  Donato Schultz, DO

## 2017-11-17 NOTE — Telephone Encounter (Signed)
Fine with me

## 2017-11-17 NOTE — Assessment & Plan Note (Signed)
abx x 10 days  Use flonase and antihistamines Stay away from decongestants  rto prn

## 2017-11-18 NOTE — Telephone Encounter (Signed)
Called and scheduled a TOC appt on 12/08/17. Pt will come fasting.

## 2017-12-01 ENCOUNTER — Ambulatory Visit (INDEPENDENT_AMBULATORY_CARE_PROVIDER_SITE_OTHER): Payer: 59 | Admitting: Family Medicine

## 2017-12-01 DIAGNOSIS — I10 Essential (primary) hypertension: Secondary | ICD-10-CM

## 2017-12-01 MED ORDER — HYDROCHLOROTHIAZIDE 25 MG PO TABS: 25.0000 mg | ORAL_TABLET | Freq: Every day | ORAL | 1 refills | Status: DC

## 2017-12-01 NOTE — Progress Notes (Signed)
Pre visit review using our clinic tool,if applicable. No additional management support is needed unless otherwise documented below in the visit note.   Pt here for Blood pressure check per ordewr from Dr. Laury AxonLowne- Chase dated   Pt currently takes:   Pt reports compliance with medication.  BP today @ = 134/92 HR =92  Pt advised per Dr. Zola ButtonLowne-Chase to start HCTZ 25 mg daily and to start a Potassium Rich Foods diet. Patient given a copy of diet and ordered medication from pharmacy. Patient to follow up in 3 weeks with provider. OPatient states she will be seeing [facility], PA-C and and she has a physical scheduled.  Reviewed  Donato SchultzYvonne R Lowne Chase, DO

## 2017-12-08 ENCOUNTER — Encounter: Payer: Self-pay | Admitting: Medical

## 2017-12-08 ENCOUNTER — Telehealth: Payer: Self-pay | Admitting: Medical

## 2017-12-08 ENCOUNTER — Ambulatory Visit: Payer: 59 | Admitting: Medical

## 2017-12-08 VITALS — BP 139/85 | HR 86 | Temp 98.0°F | Resp 16 | Ht 62.0 in | Wt 243.1 lb

## 2017-12-08 DIAGNOSIS — Z1211 Encounter for screening for malignant neoplasm of colon: Secondary | ICD-10-CM | POA: Diagnosis not present

## 2017-12-08 DIAGNOSIS — Z124 Encounter for screening for malignant neoplasm of cervix: Secondary | ICD-10-CM

## 2017-12-08 DIAGNOSIS — Z0000 Encounter for general adult medical examination without abnormal findings: Secondary | ICD-10-CM | POA: Diagnosis not present

## 2017-12-08 DIAGNOSIS — Z113 Encounter for screening for infections with a predominantly sexual mode of transmission: Secondary | ICD-10-CM

## 2017-12-08 DIAGNOSIS — Z1231 Encounter for screening mammogram for malignant neoplasm of breast: Secondary | ICD-10-CM | POA: Diagnosis not present

## 2017-12-08 DIAGNOSIS — Z87898 Personal history of other specified conditions: Secondary | ICD-10-CM

## 2017-12-08 LAB — LIPID PANEL
CHOL/HDL RATIO: 3
Cholesterol: 173 mg/dL (ref 0–200)
HDL: 57 mg/dL (ref >39.00)
NonHDL: 116.29
TRIGLYCERIDES: 214 mg/dL — AB (ref 0.0–149.0)
VLDL: 42.8 mg/dL — ABNORMAL HIGH (ref 0.0–40.0)

## 2017-12-08 LAB — CBC WITH DIFFERENTIAL/PLATELET
BASOS ABS: 0.1 10*3/uL (ref 0.0–0.1)
BASOS PCT: 1 % (ref 0.0–3.0)
Eosinophils Absolute: 0.4 10*3/uL (ref 0.0–0.7)
Eosinophils Relative: 6.6 % — ABNORMAL HIGH (ref 0.0–5.0)
HCT: 39.3 % (ref 36.0–46.0)
Hemoglobin: 13.3 g/dL (ref 12.0–15.0)
LYMPHS ABS: 1.8 10*3/uL (ref 0.7–4.0)
Lymphocytes Relative: 27.5 % (ref 12.0–46.0)
MCHC: 34 g/dL (ref 30.0–36.0)
MCV: 82.5 fl (ref 78.0–100.0)
Monocytes Absolute: 0.6 10*3/uL (ref 0.1–1.0)
Monocytes Relative: 9.8 % (ref 3.0–12.0)
NEUTROS PCT: 55.1 % (ref 43.0–77.0)
Neutro Abs: 3.6 10*3/uL (ref 1.4–7.7)
Platelets: 295 10*3/uL (ref 150.0–400.0)
RBC: 4.76 Mil/uL (ref 3.87–5.11)
RDW: 15.6 % — AB (ref 11.5–15.5)
WBC: 6.6 10*3/uL (ref 4.0–10.5)

## 2017-12-08 LAB — COMPREHENSIVE METABOLIC PANEL
ALT: 19 U/L (ref 0–35)
AST: 16 U/L (ref 0–37)
Albumin: 4 g/dL (ref 3.5–5.2)
Alkaline Phosphatase: 62 U/L (ref 39–117)
BUN: 21 mg/dL (ref 6–23)
CALCIUM: 9.5 mg/dL (ref 8.4–10.5)
CHLORIDE: 102 meq/L (ref 96–112)
CO2: 30 meq/L (ref 19–32)
Creatinine, Ser: 0.88 mg/dL (ref 0.40–1.20)
GFR: 72.06 mL/min (ref >60.00)
GLUCOSE: 102 mg/dL — AB (ref 70–99)
Potassium: 4 mEq/L (ref 3.5–5.1)
Sodium: 138 mEq/L (ref 135–145)
Total Bilirubin: 0.5 mg/dL (ref 0.2–1.2)
Total Protein: 7.1 g/dL (ref 6.0–8.3)

## 2017-12-08 LAB — POC URINALSYSI DIPSTICK (AUTOMATED)
Bilirubin, UA: NEGATIVE
Glucose, UA: NEGATIVE
KETONES UA: NEGATIVE
Leukocytes, UA: NEGATIVE
Nitrite, UA: NEGATIVE
Protein, UA: POSITIVE — AB
RBC UA: NEGATIVE
SPEC GRAV UA: 1.025 (ref 1.010–1.025)
Urobilinogen, UA: NEGATIVE E.U./dL — AB
pH, UA: 6 (ref 5.0–8.0)

## 2017-12-08 LAB — LDL CHOLESTEROL, DIRECT: Direct LDL: 95 mg/dL

## 2017-12-08 NOTE — Addendum Note (Signed)
Addended by: Gwenevere AbbotSAGUIER, Zianna Dercole M on: 12/08/2017 05:13 PM   Modules accepted: Orders

## 2017-12-08 NOTE — Telephone Encounter (Signed)
I had to take out tdap to close chart. I don't thinks it was resulted by time I was closing chart. So could you put that back in and document was done.

## 2017-12-08 NOTE — Patient Instructions (Addendum)
For you wellness exam today I have ordered cbc, cmp, lipid panel, ua and hiv.  Vaccine given today tdap  Recommend exercise and healthy diet.  We will let you know lab results as they come in.  Follow up date appointment will be determined after lab review.   Suggestions for reflux, better diet and can try zantac otc. If your joints hurt worse then let me know and can get xrays of effected area. For allergies flonase and zyrtec. If need later can add montelukast.  Refer to gi, refer to gyn and placed mammogram order.  Follow up date to be determined after lab review.  Preventive Care 40-64 Years, Female Preventive care refers to lifestyle choices and visits with your health care provider that can promote health and wellness. What does preventive care include?  A yearly physical exam. This is also called an annual well check.  Dental exams once or twice a year.  Routine eye exams. Ask your health care provider how often you should have your eyes checked.  Personal lifestyle choices, including: ? Daily care of your teeth and gums. ? Regular physical activity. ? Eating a healthy diet. ? Avoiding tobacco and drug use. ? Limiting alcohol use. ? Practicing safe sex. ? Taking low-dose aspirin daily starting at age 7. ? Taking vitamin and mineral supplements as recommended by your health care provider. What happens during an annual well check? The services and screenings done by your health care provider during your annual well check will depend on your age, overall health, lifestyle risk factors, and family history of disease. Counseling Your health care provider may ask you questions about your:  Alcohol use.  Tobacco use.  Drug use.  Emotional well-being.  Home and relationship well-being.  Sexual activity.  Eating habits.  Work and work Statistician.  Method of birth control.  Menstrual cycle.  Pregnancy history.  Screening You may have the following tests  or measurements:  Height, weight, and BMI.  Blood pressure.  Lipid and cholesterol levels. These may be checked every 5 years, or more frequently if you are over 71 years old.  Skin check.  Lung cancer screening. You may have this screening every year starting at age 69 if you have a 30-pack-year history of smoking and currently smoke or have quit within the past 15 years.  Fecal occult blood test (FOBT) of the stool. You may have this test every year starting at age 61.  Flexible sigmoidoscopy or colonoscopy. You may have a sigmoidoscopy every 5 years or a colonoscopy every 10 years starting at age 6.  Hepatitis C blood test.  Hepatitis B blood test.  Sexually transmitted disease (STD) testing.  Diabetes screening. This is done by checking your blood sugar (glucose) after you have not eaten for a while (fasting). You may have this done every 1-3 years.  Mammogram. This may be done every 1-2 years. Talk to your health care provider about when you should start having regular mammograms. This may depend on whether you have a family history of breast cancer.  BRCA-related cancer screening. This may be done if you have a family history of breast, ovarian, tubal, or peritoneal cancers.  Pelvic exam and Pap test. This may be done every 3 years starting at age 14. Starting at age 72, this may be done every 5 years if you have a Pap test in combination with an HPV test.  Bone density scan. This is done to screen for osteoporosis. You may have this  scan if you are at high risk for osteoporosis.  Discuss your test results, treatment options, and if necessary, the need for more tests with your health care provider. Vaccines Your health care provider may recommend certain vaccines, such as:  Influenza vaccine. This is recommended every year.  Tetanus, diphtheria, and acellular pertussis (Tdap, Td) vaccine. You may need a Td booster every 10 years.  Varicella vaccine. You may need this if  you have not been vaccinated.  Zoster vaccine. You may need this after age 75.  Measles, mumps, and rubella (MMR) vaccine. You may need at least one dose of MMR if you were born in 1957 or later. You may also need a second dose.  Pneumococcal 13-valent conjugate (PCV13) vaccine. You may need this if you have certain conditions and were not previously vaccinated.  Pneumococcal polysaccharide (PPSV23) vaccine. You may need one or two doses if you smoke cigarettes or if you have certain conditions.  Meningococcal vaccine. You may need this if you have certain conditions.  Hepatitis A vaccine. You may need this if you have certain conditions or if you travel or work in places where you may be exposed to hepatitis A.  Hepatitis B vaccine. You may need this if you have certain conditions or if you travel or work in places where you may be exposed to hepatitis B.  Haemophilus influenzae type b (Hib) vaccine. You may need this if you have certain conditions.  Talk to your health care provider about which screenings and vaccines you need and how often you need them. This information is not intended to replace advice given to you by your health care provider. Make sure you discuss any questions you have with your health care provider. Document Released: 05/11/2015 Document Revised: 01/02/2016 Document Reviewed: 02/13/2015 Elsevier Interactive Patient Education  [employer].

## 2017-12-08 NOTE — Progress Notes (Addendum)
Subjective:    Patient ID: Kristin Wong, female    DOB: 10/17/1965, 52 y.o.   MRN: [id number]  HPI  Pt in for first time with me.  Pt just started sedentary job. She states eating more and gaining weight. She is not working out yet. In past when she gains weight her blood pressure increase. Pt admits she is not eating healthy. 1 soda a day. 2 cups of coffee. Nonsmoker. Rare/occasional alcohol use.  Pt states with HCTZ she is eating potassium rich foods. No reported adverse side effects.  Pt is fasting today. Needs cpe.  Pt not aware of when her last papsmear was.(last one was Dr. Katrinka BlazingSmith)  Pt is ok with getting tdap today.   Pt ok with getting hiv screen today.    Review of Systems  Constitutional:       Pt desires to loose weight. She is obese.  HENT: Negative for congestion, drooling, postnasal drip, sinus pressure, sneezing and sore throat.        Year round around. Seems always mild nasal congestion  Respiratory: Negative for cough, chest tightness and wheezing.   Cardiovascular: Negative for chest pain and palpitations.  Gastrointestinal: Negative for abdominal distention, abdominal pain, blood in stool, nausea and vomiting.       Reflux daily. Tries tums and maalox. Helps some.  Genitourinary: Negative for difficulty urinating, dysuria, flank pain and genital sores.  Musculoskeletal: Negative for back pain.       Years rt knee and left knee mild intermittent pain.  Skin: Negative for color change and rash.  Neurological: Negative for dizziness, seizures, weakness, light-headedness and headaches.  Hematological: Negative for adenopathy. Does not bruise/bleed easily.  Psychiatric/Behavioral: Negative for behavioral problems, confusion and sleep disturbance. The patient is not nervous/anxious.     Past Medical History:  Diagnosis Date  . Arthritis    mild intermittent joint pain. Rt knee and left hip worst.  . Environmental allergies   . Frequent headaches   . GERD  (gastroesophageal reflux disease)    she states has heart burn alot.  . Hidradenitis   . History of chicken pox   . Hypertension   . Seasonal allergies   . UTI (lower urinary tract infection)      Social History   Socioeconomic History  . Marital status: Married    Spouse name: Not on file  . Number of children: Not on file  . Years of education: Not on file  . Highest education level: Not on file  Occupational History  . Occupation: moving company.  Social Needs  . Financial resource strain: Not on file  . Food insecurity:    Worry: Not on file    Inability: Not on file  . Transportation needs:    Medical: Not on file    Non-medical: Not on file  Tobacco Use  . Smoking status: Never Smoker  . Smokeless tobacco: Never Used  Substance and Sexual Activity  . Alcohol use: Yes    Alcohol/week: 0.0 standard drinks    Comment: rare/occasional.  . Drug use: No  . Sexual activity: Yes    Partners: Male  Lifestyle  . Physical activity:    Days per week: Not on file    Minutes per session: Not on file  . Stress: Not on file  Relationships  . Social connections:    Talks on phone: Not on file    Gets together: Not on file    Attends religious service: Not on  file    Active member of club or organization: Not on file    Attends meetings of clubs or organizations: Not on file    Relationship status: Not on file  . Intimate partner violence:    Fear of current or ex partner: Not on file    Emotionally abused: Not on file    Physically abused: Not on file    Forced sexual activity: Not on file  Other Topics Concern  . Not on file  Social History Narrative  . Not on file    Past Surgical History:  Procedure Laterality Date  . AXILLARY SURGERY     Sweat glands removed, bilateral 2007  . CHOLECYSTECTOMY     2002  . TUBAL LIGATION     1996  . WISDOM TOOTH EXTRACTION      Family History  Problem Relation Age of Onset  . Hypertension Mother        Living  . COPD  Mother   . Asthma Mother   . Scoliosis Mother   . Heart disease Father 71       Deceased  . Hyperlipidemia Father   . Lymphoma Father   . COPD Father   . Congestive Heart Failure Father   . Heart attack Father   . Anuerysm Father   . Arthritis Father   . Uterine cancer Maternal Grandmother   . Hypertension Maternal Grandmother   . Aneurysm Paternal Grandfather   . Heart disease Paternal Grandfather   . Lung cancer Maternal Grandfather   . Diabetes Paternal Grandmother   . COPD Other        Maternal Aunts & Uncles  . Heart disease Other        Maternal Aunts & Uncles  . Aneurysm Other        Paternal uncles x3  . Aneurysm Brother        #1  . Asthma Brother        #1  . Diabetes Brother        #1  . Asthma Brother   . Breast cancer Sister   . Cardiomyopathy Sister   . Arthritis Sister   . Lymphoma Son        Diego Cory -- 5 years Remission  . Scoliosis Daughter        x1  . Healthy Son        #2    Allergies  Allergen Reactions  . Anesthetics, Ester Nausea And Vomiting  . Penicillins Hives  . Morphine And Related Anxiety and Nausea And Vomiting    Current Outpatient Medications on File Prior to Visit  Medication Sig Dispense Refill  . diphenhydrAMINE (BENADRYL) 25 mg capsule Take 25 mg by mouth at bedtime. Reported on 07/03/2015    . hydrochlorothiazide (HYDRODIURIL) 25 MG tablet Take 1 tablet (25 mg total) by mouth daily. 90 tablet 1  . Multiple Vitamin (MULTIVITAMIN) tablet Take 1 tablet by mouth daily. Reported on 07/03/2015     No current facility-administered medications on file prior to visit.     BP (!) 141/93   Pulse 86   Temp 98 F (36.7 C) (Oral)   Resp 16   Ht 5\' 2"  (1.575 m)   Wt 243 lb 1.6 oz (110.3 kg)   SpO2 99%   BMI 44.46 kg/m       Objective:   Physical Exam  General Mental Status- Alert. General Appearance- Not in acute distress.   Skin General: Color- Normal Color. Moisture-  Normal Moisture. Small scatterd moles on back.  None worrisome appearance. Other area on body small tiny moles.   Neck Carotid Arteries- Normal color. Moisture- Normal Moisture. No carotid bruits. No JVD.  Chest and Lung Exam Auscultation: Breath Sounds:-Normal.  Cardiovascular Auscultation:Rythm- Regular. Murmurs & Other Heart Sounds:Auscultation of the heart reveals- No Murmurs.  Abdomen Inspection:-Inspeection Normal. Palpation/Percussion:Note:No mass. Palpation and Percussion of the abdomen reveal- Non Tender, Non Distended + BS, no rebound or guarding.   Neurologic Cranial Nerve exam:- CN III-XII intact(No nystagmus), symmetric smile. Strength:- 5/5 equal and symmetric strength both upper and lower extremities.      Assessment & Plan:  For you wellness exam today I have ordered cbc, cmp, lipid panel, ua and hiv.  Vaccine given today tdap  Recommend exercise and healthy diet.  We will let you know lab results as they come in.  Follow up date appointment will be determined after lab review.   Suggestions for reflux, better diet and can try zantac otc. If your joints hurt worse then let me know and can get xrays of effected area. For allergies flonase and zyrtec. If need later can add montelukast.  Refer to gi, refer to gyn and placed mammogram order.  Follow up date to be determined after lab review.  [facility] based on her history she needs diagnostic so I change order.  Esperanza RichtersEdward Curlee Bogan, PA-C

## 2017-12-09 ENCOUNTER — Telehealth: Payer: Self-pay | Admitting: Medical

## 2017-12-09 LAB — HIV ANTIBODY (ROUTINE TESTING W REFLEX): HIV: NONREACTIVE

## 2017-12-09 MED ORDER — METFORMIN HCL 500 MG PO TABS: 500.0000 mg | ORAL_TABLET | Freq: Two times a day (BID) | ORAL | 3 refills | Status: DC

## 2017-12-09 NOTE — Telephone Encounter (Signed)
rx metformin sent to pt pharmacy 

## 2017-12-10 ENCOUNTER — Other Ambulatory Visit (INDEPENDENT_AMBULATORY_CARE_PROVIDER_SITE_OTHER): Payer: 59

## 2017-12-10 DIAGNOSIS — R739 Hyperglycemia, unspecified: Secondary | ICD-10-CM | POA: Diagnosis not present

## 2017-12-10 LAB — HEMOGLOBIN A1C: HEMOGLOBIN A1C: 5.7 % (ref 4.6–6.5)

## 2018-01-20 ENCOUNTER — Encounter: Payer: Self-pay | Admitting: Medical

## 2018-02-03 ENCOUNTER — Ambulatory Visit (INDEPENDENT_AMBULATORY_CARE_PROVIDER_SITE_OTHER): Payer: 59 | Admitting: Obstetrics & Gynecology

## 2018-02-03 ENCOUNTER — Encounter: Payer: Self-pay | Admitting: Obstetrics & Gynecology

## 2018-02-03 VITALS — BP 129/88 | HR 101 | Ht 62.0 in | Wt 239.0 lb

## 2018-02-03 DIAGNOSIS — Z01419 Encounter for gynecological examination (general) (routine) without abnormal findings: Secondary | ICD-10-CM

## 2018-02-03 DIAGNOSIS — Z124 Encounter for screening for malignant neoplasm of cervix: Secondary | ICD-10-CM

## 2018-02-03 DIAGNOSIS — Z1151 Encounter for screening for human papillomavirus (HPV): Secondary | ICD-10-CM | POA: Diagnosis not present

## 2018-02-03 DIAGNOSIS — Z1239 Encounter for other screening for malignant neoplasm of breast: Secondary | ICD-10-CM

## 2018-02-03 DIAGNOSIS — N914 Secondary oligomenorrhea: Secondary | ICD-10-CM

## 2018-02-03 DIAGNOSIS — N951 Menopausal and female climacteric states: Secondary | ICD-10-CM

## 2018-02-03 NOTE — Progress Notes (Signed)
Subjective:     Kristin Wong is a 52 y.o. female here for a routine exam. [id number] LMP 01/12/2018. Menses are getting further apart. Had menses in July then Sept. When she does bleed its heavy. Current complaints: none.  Sexually active.    Gynecologic History Patient's last menstrual period was 01/12/2018. Contraception: BTL Last Pap: >3years. Results were: normal Last mammogram: 3 years. Results were: abnormal- pt remember getting an US   Obstetric History [phone]  The following portions of the patient's history were reviewed and updated as appropriate: allergies, current medications, past family history, past medical history, past social history, past surgical history and problem list.  Review of Systems Pertinent items are noted in HPI.    Objective:  BP 129/88   Pulse (!) 101   Ht 5\' 2"  (1.575 m)   Wt 239 lb 0.6 oz (108.4 kg)   LMP 01/12/2018   BMI 43.72 kg/m      General Appearance:    Alert, cooperative, no distress, appears stated age  Head:    Normocephalic, without obvious abnormality, atraumatic  Eyes:    conjunctiva/corneas clear, EOM's intact, both eyes  Ears:    Normal external ear canals, both ears  Nose:   Nares normal, septum midline, mucosa normal, no drainage    or sinus tenderness  Throat:   Lips, mucosa, and tongue normal; teeth and gums normal  Neck:   Supple, symmetrical, trachea midline, no adenopathy;    thyroid:  no enlargement/tenderness/nodules  Back:     Symmetric, no curvature, ROM normal, no CVA tenderness  Lungs:     Clear to auscultation bilaterally, respirations unlabored  Chest Wall:    No tenderness or deformity   Heart:    Regular rate and rhythm, S1 and S2 normal, no murmur, rub   or gallop  Breast Exam:    No tenderness, masses, or nipple abnormality  Abdomen:     Soft, non-tender, bowel sounds active all four quadrants,    no masses, no organomegaly  Genitalia:    Normal female without lesion, discharge or tenderness     Extremities:    Extremities normal, atraumatic, no cyanosis or edema  Pulses:   2+ and symmetric all extremities  Skin:   Skin color, texture, turgor normal, no rashes or lesions    Assessment:    Healthy female exam.   Perimenopause    Plan:    Follow up in: 1 year.    F/u PAP with hrHPV Screening mammogram  Caileigh Canche L. Harraway-Smith, M.D., [facility]

## 2018-02-03 NOTE — Patient Instructions (Addendum)
Mammogram A mammogram is an X-ray of the breasts that is done to check for abnormal changes. This procedure can screen for and detect any changes that may suggest breast cancer. A mammogram can also identify other changes and variations in the breast, such as:  Inflammation of the breast tissue (mastitis).  An infected area that contains a collection of pus (abscess).  A fluid-filled sac (cyst).  Fibrocystic changes. This is when breast tissue becomes denser, which can make the tissue feel rope-like or uneven under the skin.  Tumors that are not cancerous (benign).  Tell a health care provider about:  Any allergies you have.  If you have breast implants.  If you have had previous breast disease, biopsy, or surgery.  If you are breastfeeding.  Any possibility that you could be pregnant, if this applies.  If you are younger than age 59.  If you have a family history of breast cancer. What are the risks? Generally, this is a safe procedure. However, problems may occur, including:  Exposure to radiation. Radiation levels are very low with this test.  The results being misinterpreted.  The need for further tests.  The inability of the mammogram to detect certain cancers.  What happens before the procedure?  Schedule your test about 1-2 weeks after your menstrual period. This is usually when your breasts are the least tender.  If you have had a mammogram done at a different facility in the past, get the mammogram X-rays or have them sent to your current exam facility in order to compare them.  Wash your breasts and under your arms the day of the test.  Do not wear deodorants, perfumes, lotions, or powders anywhere on your body on the day of the test.  Remove any jewelry from your neck.  Wear clothes that you can change into and out of easily. What happens during the procedure?  You will undress from the waist up and put on a gown.  You will stand in front of the  X-ray machine.  Each breast will be placed between two plastic or glass plates. The plates will compress your breast for a few seconds. Try to stay as relaxed as possible during the procedure. This does not cause any harm to your breasts and any discomfort you feel will be very brief.  X-rays will be taken from different angles of each breast. The procedure may vary among health care providers and hospitals. What happens after the procedure?  The mammogram will be examined by a specialist (radiologist).  You may need to repeat certain parts of the test, depending on the quality of the images. This is commonly done if the radiologist needs a better view of the breast tissue.  Ask when your test results will be ready. Make sure you get your test results.  You may resume your normal activities. This information is not intended to replace advice given to you by your health care provider. Make sure you discuss any questions you have with your health care provider. Document Released: 04/11/2000 Document Revised: 09/17/2015 Document Reviewed: 06/23/2014 Elsevier Interactive Patient Education  2018 [employer]. Perimenopause Perimenopause is the time when your body begins to move into the menopause (no menstrual period for 12 straight months). It is a natural process. Perimenopause can begin 2-8 years before the menopause and usually lasts for 1 year after the menopause. During this time, your ovaries may or may not produce an egg. The ovaries vary in their production of estrogen and  progesterone hormones each month. This can cause irregular menstrual periods, difficulty getting pregnant, vaginal bleeding between periods, and uncomfortable symptoms. What are the causes?  Irregular production of the ovarian hormones, estrogen and progesterone, and not ovulating every month. Other causes include:  Tumor of the pituitary gland in the brain.  Medical disease that affects the ovaries.  Radiation  treatment.  Chemotherapy.  Unknown causes.  Heavy smoking and excessive alcohol intake can bring on perimenopause sooner.  What are the signs or symptoms?  Hot flashes.  Night sweats.  Irregular menstrual periods.  Decreased sex drive.  Vaginal dryness.  Headaches.  Mood swings.  Depression.  Memory problems.  Irritability.  Tiredness.  Weight gain.  Trouble getting pregnant.  The beginning of losing bone cells (osteoporosis).  The beginning of hardening of the arteries (atherosclerosis). How is this diagnosed? Your health care provider will make a diagnosis by analyzing your age, menstrual history, and symptoms. He or she will do a physical exam and note any changes in your body, especially your female organs. Female hormone tests may or may not be helpful depending on the amount of female hormones you produce and when you produce them. However, other hormone tests may be helpful to rule out other problems. How is this treated? In some cases, no treatment is needed. The decision on whether treatment is necessary during the perimenopause should be made by you and your health care provider based on how the symptoms are affecting you and your lifestyle. Various treatments are available, such as:  Treating individual symptoms with a specific medicine for that symptom.  Herbal medicines that can help specific symptoms.  Counseling.  Group therapy.  Follow these instructions at home:  Keep track of your menstrual periods (when they occur, how heavy they are, how long between periods, and how long they last) as well as your symptoms and when they started.  Only take over-the-counter or prescription medicines as directed by your health care provider.  Sleep and rest.  Exercise.  Eat a diet that contains calcium (good for your bones) and soy (acts like the estrogen hormone).  Do not smoke.  Avoid alcoholic beverages.  Take vitamin supplements as recommended  by your health care provider. Taking vitamin E may help in certain cases.  Take calcium and vitamin D supplements to help prevent bone loss.  Group therapy is sometimes helpful.  Acupuncture may help in some cases. Contact a health care provider if:  You have questions about any symptoms you are having.  You need a referral to a specialist (gynecologist, psychiatrist, or psychologist). Get help right away if:  You have vaginal bleeding.  Your period lasts longer than 8 days.  Your periods are recurring sooner than 21 days.  You have bleeding after intercourse.  You have severe depression.  You have pain when you urinate.  You have severe headaches.  You have vision problems. This information is not intended to replace advice given to you by your health care provider. Make sure you discuss any questions you have with your health care provider. Document Released: 05/22/2004 Document Revised: 09/20/2015 Document Reviewed: 11/11/2012 Elsevier Interactive Patient Education  2017 [employer].

## 2018-02-05 LAB — CYTOLOGY - PAP
Diagnosis: NEGATIVE
HPV: NOT DETECTED

## 2018-04-15 ENCOUNTER — Ambulatory Visit: Payer: 59 | Admitting: Medical

## 2018-04-16 ENCOUNTER — Ambulatory Visit: Payer: 59 | Admitting: Medical

## 2018-04-19 ENCOUNTER — Ambulatory Visit: Payer: 59 | Admitting: Medical

## 2018-04-30 ENCOUNTER — Encounter: Payer: Self-pay | Admitting: Medical

## 2018-04-30 ENCOUNTER — Ambulatory Visit: Payer: 59 | Admitting: Medical

## 2018-04-30 VITALS — BP 140/88 | HR 86 | Temp 99.0°F | Resp 16 | Ht 62.0 in | Wt 246.6 lb

## 2018-04-30 DIAGNOSIS — J329 Chronic sinusitis, unspecified: Secondary | ICD-10-CM

## 2018-04-30 DIAGNOSIS — F419 Anxiety disorder, unspecified: Secondary | ICD-10-CM | POA: Diagnosis not present

## 2018-04-30 DIAGNOSIS — F32A Depression, unspecified: Secondary | ICD-10-CM

## 2018-04-30 DIAGNOSIS — F329 Major depressive disorder, single episode, unspecified: Secondary | ICD-10-CM

## 2018-04-30 MED ORDER — DOXYCYCLINE HYCLATE 100 MG PO TABS: 100.0000 mg | ORAL_TABLET | Freq: Two times a day (BID) | ORAL | 0 refills | Status: DC

## 2018-04-30 MED ORDER — VENLAFAXINE HCL ER 37.5 MG PO CP24: ORAL_CAPSULE | ORAL | 0 refills | Status: DC

## 2018-04-30 MED ORDER — CLONAZEPAM 0.5 MG PO TABS: 0.5000 mg | ORAL_TABLET | Freq: Two times a day (BID) | ORAL | 0 refills | Status: DC | PRN

## 2018-04-30 MED ORDER — AZELASTINE HCL 0.1 % NA SOLN: 2.0000 | Freq: Two times a day (BID) | NASAL | 3 refills | Status: DC

## 2018-04-30 NOTE — Progress Notes (Signed)
Subjective:    Patient ID: Kristin Wong, female    DOB: [birth year], 53 y.o.   MRN: [id number]  HPI Pt in for some moderate anxiety in past. Worse recently since her dad died about one year ago. Now having severe anxiety. She states high level of anxiety constantly. Pt describes some crying easier. No homicidal or suicidal ideations. She has been reluctant to take medications for this in past. Now states willing to use meds.  Pt also states sinus pressure, pnd, nasal congestion and frontal area headache for one week.   Review of Systems  Constitutional: Negative for chills, fatigue and fever.  HENT: Positive for congestion, postnasal drip, sinus pressure and sinus pain. Negative for sneezing.   Respiratory: Negative for cough, chest tightness, shortness of breath and wheezing.   Cardiovascular: Negative for chest pain and palpitations.  Gastrointestinal: Negative for abdominal pain, constipation and nausea.  Musculoskeletal: Negative for back pain.  Skin: Negative for rash.  Neurological: Negative for dizziness, seizures, speech difficulty, weakness and headaches.  Hematological: Negative for adenopathy. Does not bruise/bleed easily.  Psychiatric/Behavioral: Positive for dysphoric mood. Negative for behavioral problems, confusion, self-injury and suicidal ideas. The patient is nervous/anxious.     Past Medical History:  Diagnosis Date  . Arthritis    mild intermittent joint pain. Rt knee and left hip worst.  . Environmental allergies   . Frequent headaches   . GERD (gastroesophageal reflux disease)    she states has heart burn alot.  . Hidradenitis   . History of chicken pox   . Hypertension   . Seasonal allergies   . UTI (lower urinary tract infection)      Social History   Socioeconomic History  . Marital status: Married    Spouse name: Not on file  . Number of children: Not on file  . Years of education: Not on file  . Highest education level: Not on file    Occupational History  . Occupation: moving company.  Social Needs  . Financial resource strain: Not on file  . Food insecurity:    Worry: Not on file    Inability: Not on file  . Transportation needs:    Medical: Not on file    Non-medical: Not on file  Tobacco Use  . Smoking status: Never Smoker  . Smokeless tobacco: Never Used  Substance and Sexual Activity  . Alcohol use: Yes    Alcohol/week: 0.0 standard drinks    Comment: rare/occasional.  . Drug use: No  . Sexual activity: Yes    Partners: Male  Lifestyle  . Physical activity:    Days per week: Not on file    Minutes per session: Not on file  . Stress: Not on file  Relationships  . Social connections:    Talks on phone: Not on file    Gets together: Not on file    Attends religious service: Not on file    Active member of club or organization: Not on file    Attends meetings of clubs or organizations: Not on file    Relationship status: Not on file  . Intimate partner violence:    Fear of current or ex partner: Not on file    Emotionally abused: Not on file    Physically abused: Not on file    Forced sexual activity: Not on file  Other Topics Concern  . Not on file  Social History Narrative  . Not on file    Past Surgical History:  Procedure Laterality Date  . AXILLARY SURGERY     Sweat glands removed, bilateral 2007  . CHOLECYSTECTOMY     2002  . TUBAL LIGATION     1996  . WISDOM TOOTH EXTRACTION      Family History  Problem Relation Age of Onset  . Hypertension Mother        Living  . COPD Mother   . Asthma Mother   . Scoliosis Mother   . Heart disease Father [id number]       Deceased  . Hyperlipidemia Father   . Lymphoma Father   . COPD Father   . Congestive Heart Failure Father   . Heart attack Father   . Anuerysm Father   . Arthritis Father   . Uterine cancer Maternal Grandmother   . Hypertension Maternal Grandmother   . Aneurysm Paternal Grandfather   . Heart disease Paternal Grandfather    . Lung cancer Maternal Grandfather   . Diabetes Paternal Grandmother   . COPD Other        Maternal Aunts & Uncles  . Heart disease Other        Maternal Aunts & Uncles  . Aneurysm Other        Paternal uncles x3  . Aneurysm Brother        #1  . Asthma Brother        #1  . Diabetes Brother        #1  . Asthma Brother   . Breast cancer Sister   . Cardiomyopathy Sister   . Arthritis Sister   . Lymphoma Son        Diego CoryGray Cell -- 5 years Remission  . Scoliosis Daughter        x1  . Healthy Son        #2    Allergies  Allergen Reactions  . Anesthetics, Ester Nausea And Vomiting  . Penicillins Hives  . Morphine And Related Anxiety and Nausea And Vomiting    Current Outpatient Medications on File Prior to Visit  Medication Sig Dispense Refill  . diphenhydrAMINE (BENADRYL) 25 mg capsule Take 25 mg by mouth at bedtime. Reported on 07/03/2015    . hydrochlorothiazide (HYDRODIURIL) 25 MG tablet Take 1 tablet (25 mg total) by mouth daily. 90 tablet 1  . metFORMIN (GLUCOPHAGE) 500 MG tablet Take 1 tablet (500 mg total) by mouth 2 (two) times daily with a meal. 60 tablet 3  . Multiple Vitamin (MULTIVITAMIN) tablet Take 1 tablet by mouth daily. Reported on 07/03/2015     No current facility-administered medications on file prior to visit.     BP (!) 143/96   Pulse 86   Temp 99 F (37.2 C) (Oral)   Resp 16   Ht 5\' 2"  (1.575 m)   Wt 246 lb 9.6 oz (111.9 kg)   SpO2 99%   BMI 45.10 kg/m       Objective:   Physical Exam  General  Mental Status - Alert. General Appearance - Well groomed. Not in acute distress.  Skin Rashes- No Rashes.  HEENT Head- Normal. Ear Auditory Canal - Left- Normal. Right - Normal.Tympanic Membrane- Left- Normal. Right- Normal. Eye Sclera/Conjunctiva- Left- Normal. Right- Normal. Nose & Sinuses Nasal Mucosa- Left-  Boggy and Congested. Right-  Boggy and  Congested.Bilateral maxillary and frontal sinus pressure. Mouth & Throat Lips: Upper Lip-  Normal: no dryness, cracking, pallor, cyanosis, or vesicular eruption. Lower Lip-Normal: no dryness, cracking, pallor, cyanosis or vesicular eruption. Buccal Mucosa-  Bilateral- No Aphthous ulcers. Oropharynx- No Discharge or Erythema. Tonsils: Characteristics- Bilateral- No Erythema or Congestion. Size/Enlargement- Bilateral- No enlargement. Discharge- bilateral-None.  Neck Neck- Supple. No Masses.   Chest and Lung Exam Auscultation: Breath Sounds:-Clear even and unlabored.  Cardiovascular Auscultation:Rythm- Regular, rate and rhythm. Murmurs & Other Heart Sounds:Ausculatation of the heart reveal- No Murmurs.  Lymphatic Head & Neck General Head & Neck Lymphatics: Bilateral: Description- No Localized lymphadenopathy.       Assessment & Plan:  For your recent anxiety and depression, I prescribed you Effexor and clonazepam.  Effexor is a daily medication and clonazepam is to use periodically for severe anxiety.  If in the future use clonazepam on a regular basis then will need to get you to sign controlled medication contract and give UDS.  For sinus infection, I prescribed doxycycline nasal spray.  By history appears that you have proceeding allergy component so continue Flonase.  On discussion some description of possible vasomotor rhinitis so prescribed Astelin nasal spray as well.  Follow-up in 2 to 3 weeks or as needed.  Esperanza Richters, PA-C

## 2018-04-30 NOTE — Patient Instructions (Signed)
For your recent anxiety and depression, I prescribed you Effexor and clonazepam.  Effexor is a daily medication and clonazepam is to use periodically for severe anxiety.  If in the future use clonazepam on a regular basis then will need to get you to sign controlled medication contract and give UDS.  For sinus infection, I prescribed doxycycline nasal spray.  By history appears that you have proceeding allergy component so continue Flonase.  On discussion some description of possible vasomotor rhinitis so prescribed Astelin nasal spray as well.  Follow-up in 2 to 3 weeks or as needed.

## 2018-05-21 ENCOUNTER — Encounter: Payer: Self-pay | Admitting: Medical

## 2018-05-21 ENCOUNTER — Ambulatory Visit: Payer: 59 | Admitting: Medical

## 2018-05-21 VITALS — BP 138/88 | HR 76 | Temp 98.6°F | Resp 16 | Ht 62.0 in | Wt 240.4 lb

## 2018-05-21 DIAGNOSIS — H6091 Unspecified otitis externa, right ear: Secondary | ICD-10-CM

## 2018-05-21 DIAGNOSIS — F419 Anxiety disorder, unspecified: Secondary | ICD-10-CM

## 2018-05-21 DIAGNOSIS — J309 Allergic rhinitis, unspecified: Secondary | ICD-10-CM | POA: Diagnosis not present

## 2018-05-21 DIAGNOSIS — F32A Depression, unspecified: Secondary | ICD-10-CM

## 2018-05-21 DIAGNOSIS — F329 Major depressive disorder, single episode, unspecified: Secondary | ICD-10-CM

## 2018-05-21 DIAGNOSIS — H938X1 Other specified disorders of right ear: Secondary | ICD-10-CM

## 2018-05-21 MED ORDER — NEOMYCIN-POLYMYXIN-HC 1 % OT SOLN: 3.0000 [drp] | Freq: Four times a day (QID) | OTIC | 0 refills | Status: DC

## 2018-05-21 MED ORDER — METHYLPREDNISOLONE ACETATE 40 MG/ML IJ SUSP
40.0000 mg | Freq: Once | INTRAMUSCULAR | Status: AC
  Administered 2018-05-21: 40 mg via INTRAMUSCULAR

## 2018-05-21 MED ORDER — VENLAFAXINE HCL ER 75 MG PO CP24: 75.0000 mg | ORAL_CAPSULE | Freq: Every day | ORAL | 0 refills | Status: DC

## 2018-05-21 NOTE — Patient Instructions (Signed)
Your anxiety and depression is improved since last time.  However not ideally controlled.  You were started on low dose of Effexor and I do want to increase the dose to 75 mg daily.  You never had to use clonazepam.  However you do have it available for severe anxiety/panic attacks as discussed with you today.  You seem to have resolved from the sinus infection but still has some residual eustachian tube pressure.  Also some possible mild otitis externa.  I do also think there is been allergic component with your symptoms.  Also we gave you Depo-Medrol 40 mg IM injection today.  I recommend that you continue Flonase and Astelin.  Did prescribe Cortisporin otic drops for right ear.  Presently would ask that you give me an update in about 10 to 14 days on how you are doing with your mood, anxiety, ear pressure/discomfort and no postnasal drainage.  You could just leave a message the staff or send my chart message.  If everything is going smoothly just recommend you follow-up in 3 to 4 months.

## 2018-05-21 NOTE — Progress Notes (Signed)
Subjective:    Patient ID: Kristin Wong, female    DOB: 08/24/1965, 53 y.o.   MRN: [id number]  HPI  Pt in for follow up.  She states anxiety is about 50% less. Her mood/depression only about 25% better. She states not feeling as panicky as before. She states she is sleeping better. Pt states has not talked to counselors before. She states currently no time due to work and preparing for child marriage. Oldest child.   Pt has no used any of her clonazepam. Pt read pamphlet and she was concerned.  Pt still has some residual faint sinus pressure, pnd, nasal congestion and faint rt ear pressure. This is despite flonase, astelin and doxycycline.   Review of Systems  Constitutional: Negative for chills, fatigue and fever.  HENT: Positive for congestion and postnasal drip. Negative for ear discharge and ear pain.        Ear canal faint pain.  Respiratory: Negative for cough, chest tightness, shortness of breath and wheezing.   Cardiovascular: Negative for chest pain and palpitations.  Gastrointestinal: Negative for abdominal pain and anal bleeding.  Musculoskeletal: Negative for back pain, myalgias and neck stiffness.  Skin: Negative for rash.  Neurological: Negative for dizziness, light-headedness and headaches.  Hematological: Negative for adenopathy. Does not bruise/bleed easily.  Psychiatric/Behavioral: Positive for dysphoric mood. Negative for behavioral problems, sleep disturbance and suicidal ideas. The patient is nervous/anxious.     Past Medical History:  Diagnosis Date  . Arthritis    mild intermittent joint pain. Rt knee and left hip worst.  . Environmental allergies   . Frequent headaches   . GERD (gastroesophageal reflux disease)    she states has heart burn alot.  . Hidradenitis   . History of chicken pox   . Hypertension   . Seasonal allergies   . UTI (lower urinary tract infection)      Social History   Socioeconomic History  . Marital status: Married   Spouse name: Not on file  . Number of children: Not on file  . Years of education: Not on file  . Highest education level: Not on file  Occupational History  . Occupation: moving company.  Social Needs  . Financial resource strain: Not on file  . Food insecurity:    Worry: Not on file    Inability: Not on file  . Transportation needs:    Medical: Not on file    Non-medical: Not on file  Tobacco Use  . Smoking status: Never Smoker  . Smokeless tobacco: Never Used  Substance and Sexual Activity  . Alcohol use: Yes    Alcohol/week: 0.0 standard drinks    Comment: rare/occasional.  . Drug use: No  . Sexual activity: Yes    Partners: Male  Lifestyle  . Physical activity:    Days per week: Not on file    Minutes per session: Not on file  . Stress: Not on file  Relationships  . Social connections:    Talks on phone: Not on file    Gets together: Not on file    Attends religious service: Not on file    Active member of club or organization: Not on file    Attends meetings of clubs or organizations: Not on file    Relationship status: Not on file  . Intimate partner violence:    Fear of current or ex partner: Not on file    Emotionally abused: Not on file    Physically abused: Not on file  Forced sexual activity: Not on file  Other Topics Concern  . Not on file  Social History Narrative  . Not on file    Past Surgical History:  Procedure Laterality Date  . AXILLARY SURGERY     Sweat glands removed, bilateral 2007  . CHOLECYSTECTOMY     2002  . TUBAL LIGATION     1996  . WISDOM TOOTH EXTRACTION      Family History  Problem Relation Age of Onset  . Hypertension Mother        Living  . COPD Mother   . Asthma Mother   . Scoliosis Mother   . Heart disease Father [id number]       Deceased  . Hyperlipidemia Father   . Lymphoma Father   . COPD Father   . Congestive Heart Failure Father   . Heart attack Father   . Anuerysm Father   . Arthritis Father   . Uterine  cancer Maternal Grandmother   . Hypertension Maternal Grandmother   . Aneurysm Paternal Grandfather   . Heart disease Paternal Grandfather   . Lung cancer Maternal Grandfather   . Diabetes Paternal Grandmother   . COPD Other        Maternal Aunts & Uncles  . Heart disease Other        Maternal Aunts & Uncles  . Aneurysm Other        Paternal uncles x3  . Aneurysm Brother        #1  . Asthma Brother        #1  . Diabetes Brother        #1  . Asthma Brother   . Breast cancer Sister   . Cardiomyopathy Sister   . Arthritis Sister   . Lymphoma Son        Diego CoryGray Cell -- 5 years Remission  . Scoliosis Daughter        x1  . Healthy Son        #2    Allergies  Allergen Reactions  . Anesthetics, Ester Nausea And Vomiting  . Penicillins Hives  . Morphine And Related Anxiety and Nausea And Vomiting    Current Outpatient Medications on File Prior to Visit  Medication Sig Dispense Refill  . azelastine (ASTELIN) 0.1 % nasal spray Place 2 sprays into both nostrils 2 (two) times daily. Use in each nostril as directed 30 mL 3  . clonazePAM (KLONOPIN) 0.5 MG tablet Take 1 tablet (0.5 mg total) by mouth 2 (two) times daily as needed for anxiety. 10 tablet 0  . diphenhydrAMINE (BENADRYL) 25 mg capsule Take 25 mg by mouth at bedtime. Reported on 07/03/2015    . doxycycline (VIBRA-TABS) 100 MG tablet Take 1 tablet (100 mg total) by mouth 2 (two) times daily. Can give caps or generic 20 tablet 0  . hydrochlorothiazide (HYDRODIURIL) 25 MG tablet Take 1 tablet (25 mg total) by mouth daily. 90 tablet 1  . metFORMIN (GLUCOPHAGE) 500 MG tablet Take 1 tablet (500 mg total) by mouth 2 (two) times daily with a meal. 60 tablet 3  . Multiple Vitamin (MULTIVITAMIN) tablet Take 1 tablet by mouth daily. Reported on 07/03/2015    . venlafaxine XR (EFFEXOR-XR) 37.5 MG 24 hr capsule 1 tab po q day 30 capsule 0   No current facility-administered medications on file prior to visit.     BP (!) 144/95   Pulse 76    Temp 98.6 F (37 C) (Oral)   Resp 16  Ht 5\' 2"  (1.575 m)   Wt 240 lb 6.4 oz (109 kg)   SpO2 99%   BMI 43.97 kg/m       Objective:   Physical Exam  General  Mental Status - Alert. General Appearance - Well groomed. Not in acute distress.  Skin Rashes- No Rashes.  HEENT Head- Normal. Ear Auditory Canal - Left- Normal. Right - clear but faint tender mid aspect of canal when viewed with otocsopeTympanic Membrane- Left- Normal. Right- Normal. Eye Sclera/Conjunctiva- Left- Normal. Right- Normal. Nose & Sinuses Nasal Mucosa- Left-  Boggy and Congested. Right-  Boggy and  Congested.Bilateral  No maxillary and no frontal sinus pressure. Mouth & Throat Lips: Upper Lip- Normal: no dryness, cracking, pallor, cyanosis, or vesicular eruption. Lower Lip-Normal: no dryness, cracking, pallor, cyanosis or vesicular eruption. Buccal Mucosa- Bilateral- No Aphthous ulcers. Oropharynx- No Discharge or Erythema. Tonsils: Characteristics- Bilateral- No Erythema or Congestion. Size/Enlargement- Bilateral- No enlargement. Discharge- bilateral-None.  Neck Neck- Supple. No Masses.  Chest and Lung Exam Auscultation: Breath Sounds:-Clear even and unlabored.  Cardiovascular Auscultation:Rythm- Regular, rate and rhythm. Murmurs & Other Heart Sounds:Ausculatation of the heart reveal- No Murmurs.  Lymphatic Head & Neck General Head & Neck Lymphatics: Bilateral: Description- No Localized lymphadenopathy.       Assessment & Plan:  Your anxiety and depression is improved since last time.  However not ideally controlled.  You were started on low dose of Effexor and I do want to increase the dose to 75 mg daily.  You never had to use clonazepam.  However you do have it available for severe anxiety/panic attacks as discussed with you today.  You seem to have resolved from the sinus infection but still has some residual eustachian tube pressure.  Also some possible mild otitis externa.  I do also  think there is been allergic component with your symptoms.  Also we gave you Depo-Medrol 40 mg IM injection today.  I recommend that you continue Flonase and Astelin.  Did prescribe Cortisporin otic drops for right ear.  Presently would ask that you give me an update in about 10 to 14 days on how you are doing with your mood, anxiety, ear pressure/discomfort and no postnasal drainage.  You could just leave a message the staff or send my chart message.  If everything is going smoothly just recommend you follow-up in 3 to 4 months.  Esperanza Richters, PA-C

## 2018-06-17 ENCOUNTER — Other Ambulatory Visit: Payer: Self-pay | Admitting: Medical

## 2018-06-23 ENCOUNTER — Ambulatory Visit: Payer: 59 | Admitting: Family Medicine

## 2018-06-23 ENCOUNTER — Encounter: Payer: Self-pay | Admitting: Family Medicine

## 2018-06-23 VITALS — BP 124/86 | HR 103 | Temp 98.7°F | Ht 62.0 in | Wt 230.0 lb

## 2018-06-23 DIAGNOSIS — S46811A Strain of other muscles, fascia and tendons at shoulder and upper arm level, right arm, initial encounter: Secondary | ICD-10-CM | POA: Diagnosis not present

## 2018-06-23 MED ORDER — CYCLOBENZAPRINE HCL 10 MG PO TABS: 10.0000 mg | ORAL_TABLET | Freq: Two times a day (BID) | ORAL | 0 refills | Status: DC | PRN

## 2018-06-23 MED ORDER — PREDNISONE 20 MG PO TABS: 40.0000 mg | ORAL_TABLET | Freq: Every day | ORAL | 0 refills | Status: AC

## 2018-06-23 NOTE — Patient Instructions (Addendum)
Heat (pad or rice pillow in microwave) over affected area, 10-15 minutes twice daily.   OK to take Tylenol 1000 mg (2 extra strength tabs) or 975 mg (3 regular strength tabs) every 6 hours as needed.  Send me a MyChart message in 3-4 weeks if no improvement. If worsening, I need to know sooner.   Trapezius stretches/exercises Do exercises exactly as told by your health care provider and adjust them as directed. It is normal to feel mild stretching, pulling, tightness, or discomfort as you do these exercises, but you should stop right away if you feel sudden pain or your pain gets worse.  Stretching and range of motion exercises These exercises warm up your muscles and joints and improve the movement and flexibility of your shoulder. These exercises can also help to relieve pain, numbness, and tingling. If you are unable to do any of the following for any reason, do not further attempt to do it.   Exercise A: Flexion, standing    1. Stand and hold a broomstick, a cane, or a similar object. Place your hands a little more than shoulder-width apart on the object. Your left / right hand should be palm-up, and your other hand should be palm-down. 2. Push the stick to raise your left / right arm out to your side and then over your head. Use your other hand to help move the stick. Stop when you feel a stretch in your shoulder, or when you reach the angle that is recommended by your health care provider. ? Avoid shrugging your shoulder while you raise your arm. Keep your shoulder blade tucked down toward your spine. 3. Hold for 30 seconds. 4. Slowly return to the starting position. Repeat 2 times. Complete this exercise 3 times per week.  Exercise B: Abduction, supine    1. Lie on your back and hold a broomstick, a cane, or a similar object. Place your hands a little more than shoulder-width apart on the object. Your left / right hand should be palm-up, and your other hand should be  palm-down. 2. Push the stick to raise your left / right arm out to your side and then over your head. Use your other hand to help move the stick. Stop when you feel a stretch in your shoulder, or when you reach the angle that is recommended by your health care provider. ? Avoid shrugging your shoulder while you raise your arm. Keep your shoulder blade tucked down toward your spine. 3. Hold for 30 seconds. 4. Slowly return to the starting position. Repeat 2 times. Complete this exercise 3 times per week.  Exercise C: Flexion, active-assisted    1. Lie on your back. You may bend your knees for comfort. 2. Hold a broomstick, a cane, or a similar object. Place your hands about shoulder-width apart on the object. Your palms should face toward your feet. 3. Raise the stick and move your arms over your head and behind your head, toward the floor. Use your healthy arm to help your left / right arm move farther. Stop when you feel a gentle stretch in your shoulder, or when you reach the angle where your health care provider tells you to stop. 4. Hold for 30 seconds. 5. Slowly return to the starting position. Repeat 2 times. Complete this exercise 3 times per week.  Exercise D: External rotation and abduction    1. Stand in a door frame with one of your feet slightly in front of the other. This  is called a staggered stance. 2. Choose one of the following positions as told by your health care provider: ? Place your hands and forearms on the door frame above your head. ? Place your hands and forearms on the door frame at the height of your head. ? Place your hands on the door frame at the height of your elbows. 3. Slowly move your weight onto your front foot until you feel a stretch across your chest and in the front of your shoulders. Keep your head and chest upright and keep your abdominal muscles tight. 4. Hold for 30 seconds. 5. To release the stretch, shift your weight to your back  foot. Repeat 2 times. Complete this stretch 3 times per week.  Strengthening exercises These exercises build strength and endurance in your shoulder. Endurance is the ability to use your muscles for a long time, even after your muscles get tired. Exercise E: Scapular depression and adduction  1. Sit on a stable chair. Support your arms in front of you with pillows, armrests, or a tabletop. Keep your elbows in line with the sides of your body. 2. Gently move your shoulder blades down toward your middle back. Relax the muscles on the tops of your shoulders and in the back of your neck. 3. Hold for 3 seconds. 4. Slowly release the tension and relax your muscles completely before doing this exercise again. Repeat for a total of 10 repetitions. 5. After you have practiced this exercise, try doing the exercise without the arm support. Then, try the exercise while standing instead of sitting. Repeat 2 times. Complete this exercise 3 times per week.  Exercise F: Shoulder abduction, isometric    1. Stand or sit about 4-6 inches (10-15 cm) from a wall with your left / right side facing the wall. 2. Bend your left / right elbow and gently press your elbow against the wall. 3. Increase the pressure slowly until you are pressing as hard as you can without shrugging your shoulder. 4. Hold for 3 seconds. 5. Slowly release the tension and relax your muscles completely. Repeat for a total of 10 repetitions. Repeat 2 times. Complete this exercise 3 times per week.  Exercise G: Shoulder flexion, isometric    1. Stand or sit about 4-6 inches (10-15 cm) away from a wall with your left / right side facing the wall. 2. Keep your left / right elbow straight and gently press the top of your fist against the wall. Increase the pressure slowly until you are pressing as hard as you can without shrugging your shoulder. 3. Hold for 10-15 seconds. 4. Slowly release the tension and relax your muscles completely.  Repeat for a total of 10 repetitions. Repeat 2 times. Complete this exercise 3 times per week.  Exercise H: Internal rotation    1. Sit in a stable chair without armrests, or stand. Secure an exercise band at your left / right side, at elbow height. 2. Place a soft object, such as a folded towel or a small pillow, under your left / right upper arm so your elbow is a few inches (about 8 cm) away from your side. 3. Hold the end of the exercise band so the band stretches. 4. Keeping your elbow pressed against the soft object under your arm, move your forearm across your body toward your abdomen. Keep your body steady so the movement is only coming from your shoulder. 5. Hold for 3 seconds. 6. Slowly return to the starting position.  Repeat for a total of 10 repetitions. Repeat 2 times. Complete this exercise 3 times per week.  Exercise I: External rotation    1. Sit in a stable chair without armrests, or stand. 2. Secure an exercise band at your left / right side, at elbow height. 3. Place a soft object, such as a folded towel or a small pillow, under your left / right upper arm so your elbow is a few inches (about 8 cm) away from your side. 4. Hold the end of the exercise band so the band stretches. 5. Keeping your elbow pressed against the soft object under your arm, move your forearm out, away from your abdomen. Keep your body steady so the movement is only coming from your shoulder. 6. Hold for 3 seconds. 7. Slowly return to the starting position. Repeat for a total of 10 repetitions. Repeat 2 times. Complete this exercise 3 times per week. Exercise J: Shoulder extension  1. Sit in a stable chair without armrests, or stand. Secure an exercise band to a stable object in front of you so the band is at shoulder height. 2. Hold one end of the exercise band in each hand. Your palms should face each other. 3. Straighten your elbows and lift your hands up to shoulder height. 4. Step back,  away from the secured end of the exercise band, until the band stretches. 5. Squeeze your shoulder blades together and pull your hands down to the sides of your thighs. Stop when your hands are straight down by your sides. Do not let your hands go behind your body. 6. Hold for 3 seconds. 7. Slowly return to the starting position. Repeat for a total of 10 repetitions. Repeat 2 times. Complete this exercise 3 times per week.  Exercise K: Shoulder extension, prone    1. Lie on your abdomen on a firm surface so your left / right arm hangs over the edge. 2. Hold a 5 lb weight in your hand so your palm faces in toward your body. Your arm should be straight. 3. Squeeze your shoulder blade down toward the middle of your back. 4. Slowly raise your arm behind you, up to the height of the surface that you are lying on. Keep your arm straight. 5. Hold for 3 seconds. 6. Slowly return to the starting position and relax your muscles. Repeat for a total of 10 repetitions. Repeat 2 times. Complete this exercise 3 times per week.   Exercise L: Horizontal abduction, prone  1. Lie on your abdomen on a firm surface so your left / right arm hangs over the edge. 2. Hold a 5 lb weight in your hand so your palm faces toward your feet. Your arm should be straight. 3. Squeeze your shoulder blade down toward the middle of your back. 4. Bend your elbow so your hand moves up, until your elbow is bent to an "L" shape (90 degrees). With your elbow bent, slowly move your forearm forward and up. Raise your hand up to the height of the surface that you are lying on. ? Your upper arm should not move, and your elbow should stay bent. ? At the top of the movement, your palm should face the floor. 5. Hold for 3 seconds. 6. Slowly return to the starting position and relax your muscles. Repeat for a total of 10 repetitions. Repeat 2 times. Complete this exercise 3 times per week.  Exercise M: Horizontal abduction, standing   1. Sit on a stable chair,  or stand. 2. Secure an exercise band to a stable object in front of you so the band is at shoulder height. 3. Hold one end of the exercise band in each hand. 4. Straighten your elbows and lift your hands straight in front of you, up to shoulder height. Your palms should face down, toward the floor. 5. Step back, away from the secured end of the exercise band, until the band stretches. 6. Move your arms out to your sides, and keep your arms straight. 7. Hold for 3 seconds. 8. Slowly return to the starting position. Repeat for a total of 10 repetitions. Repeat 2 times. Complete this exercise 3 times per week.  Exercise N: Scapular retraction and elevation  1. Sit on a stable chair, or stand. 2. Secure an exercise band to a stable object in front of you so the band is at shoulder height. 3. Hold one end of the exercise band in each hand. Your palms should face each other. 4. Sit in a stable chair without armrests, or stand. 5. Step back, away from the secured end of the exercise band, until the band stretches. 6. Squeeze your shoulder blades together and lift your hands over your head. Keep your elbows straight. 7. Hold for 3 seconds. 8. Slowly return to the starting position. Repeat for a total of 10 repetitions. Repeat 2 times. Complete this exercise 3 times per week.  This information is not intended to replace advice given to you by your health care provider. Make sure you discuss any questions you have with your health care provider. Document Released: 04/14/2005 Document Revised: 12/20/2015 Document Reviewed: 03/01/2015 Elsevier Interactive Patient Education  2017 [employer].

## 2018-06-23 NOTE — Progress Notes (Signed)
Musculoskeletal Exam  Patient: Kristin Wong DOB: 1965/09/02  DOS: 06/23/2018  SUBJECTIVE:  Chief Complaint:   Chief Complaint  Patient presents with  . Neck Pain    for one week    Kristin Wong is a 53 y.o.  female for evaluation and treatment of R sided neck pain.   Onset:  1 week ago. No inj or change in activity.  Location: R neck/shoulder Character:  burning and sharp  Progression of issue:  has worsened Associated symptoms: radiates around collarbone Treatment: to date has been OTC NSAIDS, ice, heat, massage, and acetaminophen.   Neurovascular symptoms: no  ROS: Musculoskeletal/Extremities: +neck pain  Past Medical History:  Diagnosis Date  . Arthritis    mild intermittent joint pain. Rt knee and left hip worst.  . Environmental allergies   . Frequent headaches   . GERD (gastroesophageal reflux disease)    she states has heart burn alot.  . Hidradenitis   . History of chicken pox   . Hypertension   . Seasonal allergies   . UTI (lower urinary tract infection)     Objective: VITAL SIGNS: BP 124/86 (BP Location: Left Arm, Patient Position: Sitting, Cuff Size: Large)   Pulse (!) 103   Temp 98.7 F (37.1 C) (Oral)   Ht 5\' 2"  (1.575 m)   Wt 230 lb (104.3 kg)   SpO2 98%   BMI 42.07 kg/m  Constitutional: Well formed, well developed. No acute distress. Cardiovascular: Brisk cap refill Thorax & Lungs: No accessory muscle use Musculoskeletal: R shoulder/neck.   Normal active range of motion: yes.   Normal passive range of motion: no Tenderness to palpation: Yes, over R trap Tests positive: none Tests negative: Spurling's, Neer's, cross over, hawkins Neurologic: Normal sensory function. No focal deficits noted. DTR's equal and symmetry in UE's. No clonus. Psychiatric: Normal mood. Age appropriate judgment and insight. Alert & oriented x 3.    Assessment:  Strain of right trapezius muscle, initial encounter - Plan: predniSONE (DELTASONE) 20 MG tablet,  cyclobenzaprine (FLEXERIL) 10 MG tablet  Plan: Orders as above. Stretches/exercises, Tylenol, ice/heat. Warning for Flexeril given. If no ipmrovement, can consider trigger pt injection, PT. F/u prn. The patient voiced understanding and agreement to the plan.   Kristin Roche [place], DO 06/23/18  4:28 PM

## 2018-07-17 ENCOUNTER — Other Ambulatory Visit: Payer: Self-pay | Admitting: Medical

## 2018-07-19 NOTE — Telephone Encounter (Signed)
10 tab rx of clonazepam sent to pharmacy. Noify pt if needs other rx on regular basis will need to sign contract and give uds.

## 2018-07-19 NOTE — Telephone Encounter (Signed)
Refill Request: Clonazepam  Last RX:04/30/18 Last OV:06/23/18 Next YO:VZCH scheduled  YIF:OYDX on file  AJO:INOM of file  CSR:

## 2018-07-29 ENCOUNTER — Encounter: Payer: Self-pay | Admitting: Medical

## 2018-09-24 ENCOUNTER — Other Ambulatory Visit: Payer: Self-pay | Admitting: Medical

## 2018-09-28 NOTE — Telephone Encounter (Signed)
Left pt a message to call to schedule vov.  

## 2018-11-23 ENCOUNTER — Ambulatory Visit (INDEPENDENT_AMBULATORY_CARE_PROVIDER_SITE_OTHER): Payer: Self-pay | Admitting: Medical

## 2018-11-23 ENCOUNTER — Other Ambulatory Visit: Payer: Self-pay

## 2018-11-23 DIAGNOSIS — R739 Hyperglycemia, unspecified: Secondary | ICD-10-CM

## 2018-11-23 DIAGNOSIS — I10 Essential (primary) hypertension: Secondary | ICD-10-CM

## 2018-11-23 DIAGNOSIS — T7840XA Allergy, unspecified, initial encounter: Secondary | ICD-10-CM

## 2018-11-23 MED ORDER — PREDNISONE 10 MG PO TABS: ORAL_TABLET | ORAL | 0 refills | Status: DC

## 2018-11-23 MED ORDER — HYDROXYZINE HCL 25 MG PO TABS: 25.0000 mg | ORAL_TABLET | Freq: Three times a day (TID) | ORAL | 0 refills | Status: DC | PRN

## 2018-11-23 MED ORDER — METFORMIN HCL 500 MG PO TABS: 500.0000 mg | ORAL_TABLET | Freq: Two times a day (BID) | ORAL | 3 refills | Status: DC

## 2018-11-23 MED ORDER — HYDROCHLOROTHIAZIDE 25 MG PO TABS: 25.0000 mg | ORAL_TABLET | Freq: Every day | ORAL | 1 refills | Status: DC

## 2018-11-23 MED ORDER — VENLAFAXINE HCL ER 75 MG PO CP24: ORAL_CAPSULE | ORAL | 3 refills | Status: DC

## 2018-11-23 NOTE — Patient Instructions (Addendum)
For allergic reaction, rx tapered dose prednisone, rx hydroxyzine and can continue calamine lotion. If rash worsens or changes let us know. Rx advisement on meds given.  For htn, please check bp at home and send me update by my chart.  For elevated sugar put future order lab to get a1c and cmp.  Follow up for allergic reaction 5-7 days or as needed.  Follow up date for chronic problems to be determined after lab review.

## 2018-11-23 NOTE — Progress Notes (Signed)
   Subjective:    Patient ID: Kristin Wong, female    DOB: 01/27/1966, 53 y.o.   MRN: [id number]  HPI  Virtual Visit via Video Note  I connected with Kristin Wong on 11/23/18 at  1:40 PM EDT by a video enabled telemedicine application and verified that I am speaking with the correct person using two identifiers.  Location: Patient: home Provider: office.  Pt did not check bp today. Will check at [place] and my chart.    I discussed the limitations of evaluation and management by telemedicine and the availability of in person appointments. The patient expressed understanding and agreed to proceed.  History of Present Illness: Pt had rash x 4 days. She got rash under her rt eye first day(over next 3 days rash spread to neck, forehead, arms and legs). Puppy of daughter was playing outside and then came in and rubbed/rolled all over her. Then she got rash all over. Pt very susceptible to poison ivy. She thinks puppy got into that. She is calamine lotion not helped much at all.   Pt is prediabetic. She has not been checking her sugars. She has not been on metformin. Last a1c was 5.7.      Observations/Objective: General-no acute distress, pleasant, oriented. Lungs- on inspection lungs appear unlabored. Neck- no tracheal deviation or jvd on inspection. Neuro- gross motor function appears intact. Skin- pink rash on both cheeks, forehead, arms, and legs.  Assessment and Plan: For allergic reaction, rx tapered dose prednisone, rx hydroxyzine and can continue calamine lotion. If rash worsens or changes let us know. Rx advisement on meds given.  For htn, please check bp at home and send me update by my chart.  For elevated sugar put future order lab to get a1c and cmp.  Follow up for allergic reaction 5-7 days or as needed.  Follow up date for chronic problems to be determined after lab review.  Follow Up Instructions:    I discussed the assessment and treatment plan with the  patient. The patient was provided an opportunity to ask questions and all were answered. The patient agreed with the plan and demonstrated an understanding of the instructions.   The patient was advised to call back or seek an in-person evaluation if the symptoms worsen or if the condition fails to improve as anticipated.  I provided  25 minutes of non-face-to-face time during this encounter.   Mackie Pai, PA-C   Review of Systems     Objective:   Physical Exam        Assessment & Plan:

## 2018-12-02 ENCOUNTER — Encounter: Payer: Self-pay | Admitting: Medical

## 2019-04-14 ENCOUNTER — Other Ambulatory Visit: Payer: Self-pay | Admitting: Medical

## 2019-04-14 NOTE — Telephone Encounter (Signed)
Pt wanted refill  Of effexor. Gave one month refill. Please get her scheduled for follow up by end of next month.

## 2019-04-20 NOTE — Telephone Encounter (Signed)
Left message on machine for patient to call back to schedule appointment.

## 2019-05-13 ENCOUNTER — Other Ambulatory Visit: Payer: Self-pay

## 2019-05-16 ENCOUNTER — Other Ambulatory Visit: Payer: Self-pay | Admitting: Medical

## 2019-05-16 ENCOUNTER — Other Ambulatory Visit: Payer: Self-pay

## 2019-05-16 ENCOUNTER — Encounter: Payer: Self-pay | Admitting: Gastroenterology

## 2019-05-16 ENCOUNTER — Ambulatory Visit (INDEPENDENT_AMBULATORY_CARE_PROVIDER_SITE_OTHER): Payer: Self-pay | Admitting: Medical

## 2019-05-16 VITALS — BP 122/72 | HR 95 | Temp 97.2°F | Resp 16 | Ht 62.0 in | Wt 248.2 lb

## 2019-05-16 DIAGNOSIS — R739 Hyperglycemia, unspecified: Secondary | ICD-10-CM

## 2019-05-16 DIAGNOSIS — I10 Essential (primary) hypertension: Secondary | ICD-10-CM

## 2019-05-16 DIAGNOSIS — Z1211 Encounter for screening for malignant neoplasm of colon: Secondary | ICD-10-CM

## 2019-05-16 DIAGNOSIS — F419 Anxiety disorder, unspecified: Secondary | ICD-10-CM

## 2019-05-16 MED ORDER — VENLAFAXINE HCL ER 150 MG PO CP24: 150.0000 mg | ORAL_CAPSULE | Freq: Every day | ORAL | 5 refills | Status: DC

## 2019-05-16 NOTE — Patient Instructions (Addendum)
Htn- bp is well controlled today. Continue current bp medication.  For elevated sugar, I put I future order for cmp and a1c. Also add lipid panel so get labs done fasting.  Anxiety moderate with some increase past two months. Will increase effexor to 150 mg dose. Continue to use clonazepam very sparingly.  Refer to gi for screening colonoscopy.  Flu vaccine today.  Follow up 2 weeks for mood. For bp, and elevated sugar 3-6 months.

## 2019-05-16 NOTE — Progress Notes (Signed)
Subjective:    Patient ID: Kristin Wong, female    DOB: [birth year], 54 y.o.   MRN: [id number]  HPI  Pt in for follow up.  She has been at home. 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 But has been watching/taking care of her mom.   Pt has hx of anxiety. Pt has been on effexor 75 mg. She still feels anxious over past 2 months and feels like she needs increase.  She has rx of clonzepam to use if needed. Last rx I filled for clonazepam was in march. 10 tab rx and she has 9 tablets left.   Pt has hx of prediabetes. She is on metformin 500 mg twice daily. Will get a1c today. Some gi signs/symptoms about twice a week with metformin. Sometimes makes her nauseas  Pt has not had flu vaccine this year. Will get today.  Review of Systems  Constitutional: Negative for chills, fatigue and fever.  HENT: Negative for congestion, ear discharge, ear pain, postnasal drip, rhinorrhea and sinus pain.   Respiratory: Negative for cough, chest tightness, shortness of breath and wheezing.   Cardiovascular: Negative for chest pain and palpitations.  Gastrointestinal: Negative for abdominal pain.  Musculoskeletal: Negative for back pain.  Neurological: Negative for dizziness, speech difficulty, weakness and light-headedness.  Hematological: Negative for adenopathy. Does not bruise/bleed easily.  Psychiatric/Behavioral: Negative for behavioral problems, dysphoric mood and suicidal ideas. The patient is nervous/anxious.     Past Medical History:  Diagnosis Date  . Arthritis    mild intermittent joint pain. Rt knee and left hip worst.  . Environmental allergies   . Frequent headaches   . GERD (gastroesophageal reflux disease)    she states has heart burn alot.  . Hidradenitis   . History of chicken pox   . Hypertension   . Seasonal allergies   . UTI (lower urinary tract infection)      Social History   Socioeconomic History  . Marital status: Married    Spouse name: Not on  file  . Number of children: Not on file  . Years of education: Not on file  . Highest education level: Not on file  Occupational History  . Occupation: moving company.  Tobacco Use  . Smoking status: Never Smoker  . Smokeless tobacco: Never Used  Substance and Sexual Activity  . Alcohol use: Yes    Alcohol/week: 0.0 standard drinks    Comment: rare/occasional.  . Drug use: No  . Sexual activity: Yes    Partners: Male  Other Topics Concern  . Not on file  Social History Narrative  . Not on file   Social Determinants of Health   Financial Resource Strain:   . Difficulty of Paying Living Expenses: Not on file  Food Insecurity:   . Worried About Programme researcher, broadcasting/film/video in the Last Year: Not on file  . Ran Out of Food in the Last Year: Not on file  Transportation Needs:   . Lack of Transportation (Medical): Not on file  . Lack of Transportation (Non-Medical): Not on file  Physical Activity:   . Days of Exercise per Week: Not on file  . Minutes of Exercise per Session: Not on file  Stress:   . Feeling of Stress : Not on file  Social Connections:   . Frequency of Communication with Friends and Family: Not on file  . Frequency of Social Gatherings with Friends and Family: Not on file  . Attends Religious Services:  Not on file  . Active Member of Clubs or Organizations: Not on file  . Attends Archivist Meetings: Not on file  . Marital Status: Not on file  Intimate Partner Violence:   . Fear of Current or Ex-Partner: Not on file  . Emotionally Abused: Not on file  . Physically Abused: Not on file  . Sexually Abused: Not on file    Past Surgical History:  Procedure Laterality Date  . AXILLARY SURGERY     Sweat glands removed, bilateral 2007  . CHOLECYSTECTOMY     2002  . TUBAL LIGATION     1996  . WISDOM TOOTH EXTRACTION      Family History  Problem Relation Age of Onset  . Hypertension Mother        Living  . COPD Mother   . Asthma Mother   . Scoliosis  Mother   . Heart disease Father 107       Deceased  . Hyperlipidemia Father   . Lymphoma Father   . COPD Father   . Congestive Heart Failure Father   . Heart attack Father   . Anuerysm Father   . Arthritis Father   . Uterine cancer Maternal Grandmother   . Hypertension Maternal Grandmother   . Aneurysm Paternal Grandfather   . Heart disease Paternal Grandfather   . Lung cancer Maternal Grandfather   . Diabetes Paternal Grandmother   . COPD Other        Maternal Aunts & Uncles  . Heart disease Other        Maternal Aunts & Uncles  . Aneurysm Other        Paternal uncles x3  . Aneurysm Brother        #1  . Asthma Brother        #1  . Diabetes Brother        #1  . Asthma Brother   . Breast cancer Sister   . Cardiomyopathy Sister   . Arthritis Sister   . Lymphoma Son        [employer] S[employer]eper -- 5 years Remission  . Scoliosis Daughter        x1  . Healthy Son        #2    Allergies  Allergen Reactions  . Anesthetics, Ester Nausea And Vomiting  . Penicillins Hives  . Morphine And Related Anxiety and Nausea And Vomiting    Current Outpatient Medications on File Prior to Visit  Medication Sig Dispense Refill  . azelastine (ASTELIN) 0.1 % nasal spray Place 2 sprays into both nostrils 2 (two) times daily. Use in each nostril as directed 30 mL 3  . clonazePAM (KLONOPIN) 0.5 MG tablet Take 1 tablet by mouth twice daily as needed for anxiety 10 tablet 0  . cyclobenzaprine (FLEXERIL) 10 MG tablet Take 1 tablet (10 mg total) by mouth 2 (two) times daily as needed for muscle spasms. 21 tablet 0  . diphenhydrAMINE (BENADRYL) 25 mg capsule Take 25 mg by mouth at bedtime. Reported on 07/03/2015    . hydrochlorothiazide (HYDRODIURIL) 25 MG tablet Take 1 tablet (25 mg total) by mouth daily. 90 tablet 1  . hydrOXYzine (ATARAX/VISTARIL) 25 MG tablet Take 1 tablet (25 mg total) by mouth every 8 (eight) hours as needed for itching. 30 tablet 0  . metFORMIN (GLUCOPHAGE) 500 MG tablet Take 1 tablet  (500 mg total) by mouth 2 (two) times daily with a meal. 60 tablet 3  . Multiple Vitamin (MULTIVITAMIN)  tablet Take 1 tablet by mouth daily. Reported on 07/03/2015    . NEOMYCIN-POLYMYXIN-HYDROCORTISONE (CORTISPORIN) 1 % SOLN OTIC solution Place 3 drops into the right ear every 6 (six) hours. 10 mL 0  . venlafaxine XR (EFFEXOR-XR) 75 MG 24 hr capsule TAKE 1 CAPSULE BY MOUTH ONCE DAILY WITH BREAKFAST 30 capsule 0   No current facility-administered medications on file prior to visit.    BP 122/72 (BP Location: Left Arm, Patient Position: Sitting, Cuff Size: Large)   Pulse 95   Temp (!) 97.2 F (36.2 C) (Temporal)   Resp 16   Ht 5\' 2"  (1.575 m)   Wt 248 lb 3.2 oz (112.6 kg)   SpO2 99%   BMI 45.40 kg/m       Objective:   Physical Exam  General Mental Status- Alert. General Appearance- Not in acute distress.   Skin General: Color- Normal Color. Moisture- Normal Moisture.  Neck Carotid Arteries- Normal color. Moisture- Normal Moisture. No carotid bruits. No JVD.  Chest and Lung Exam Auscultation: Breath Sounds:-Normal.  Cardiovascular Auscultation:Rythm- Regular. Murmurs & Other Heart Sounds:Auscultation of the heart reveals- No Murmurs.  Abdomen Inspection:-Inspeection Normal. Palpation/Percussion:Note:No mass. Palpation and Percussion of the abdomen reveal- Non Tender, Non Distended + BS, no rebound or guarding.    Neurologic Cranial Nerve exam:- CN III-XII intact(No nystagmus), symmetric smile. Strength:- 5/5 equal and symmetric strength both upper and lower extremities.      Assessment & Plan:  Htn- bp is well controlled today. Continue current bp medication.  For elevated sugar, I put I future order for cmp and a1c. Also add lipid panel so get labs done fasting.  Anxiety moderate with some increase past two months. Will increase effexor to 150 mg dose. Continue to use clonazepam very sparingly.  Refer to gi for screening colonoscopy.  Flu vaccine  today.  Follow up 2 weeks for mood. For bp, and elevated sugar 3-6 months.  20 + minutes spent with pt. 50% of time spent counseling pt on plan going forward.  , PA-C

## 2019-05-17 ENCOUNTER — Other Ambulatory Visit (INDEPENDENT_AMBULATORY_CARE_PROVIDER_SITE_OTHER): Payer: Self-pay

## 2019-05-17 ENCOUNTER — Other Ambulatory Visit: Payer: Self-pay

## 2019-05-17 DIAGNOSIS — R739 Hyperglycemia, unspecified: Secondary | ICD-10-CM

## 2019-05-17 DIAGNOSIS — I10 Essential (primary) hypertension: Secondary | ICD-10-CM

## 2019-05-17 LAB — COMPREHENSIVE METABOLIC PANEL
ALT: 22 U/L (ref 0–35)
AST: 18 U/L (ref 0–37)
Albumin: 4.3 g/dL (ref 3.5–5.2)
Alkaline Phosphatase: 61 U/L (ref 39–117)
BUN: 20 mg/dL (ref 6–23)
CO2: 27 mEq/L (ref 19–32)
Calcium: 9.5 mg/dL (ref 8.4–10.5)
Chloride: 102 mEq/L (ref 96–112)
Creatinine, Ser: 0.82 mg/dL (ref 0.40–1.20)
GFR: 73.14 mL/min (ref >60.00)
Glucose, Bld: 101 mg/dL — ABNORMAL HIGH (ref 70–99)
Potassium: 3.9 mEq/L (ref 3.5–5.1)
Sodium: 138 mEq/L (ref 135–145)
Total Bilirubin: 0.4 mg/dL (ref 0.2–1.2)
Total Protein: 7.3 g/dL (ref 6.0–8.3)

## 2019-05-17 LAB — LIPID PANEL
Cholesterol: 217 mg/dL — ABNORMAL HIGH (ref 0–200)
HDL: 56.9 mg/dL (ref >39.00)
NonHDL: 160.07
Total CHOL/HDL Ratio: 4
Triglycerides: 254 mg/dL — ABNORMAL HIGH (ref 0.0–149.0)
VLDL: 50.8 mg/dL — ABNORMAL HIGH (ref 0.0–40.0)

## 2019-05-17 LAB — HEMOGLOBIN A1C: Hgb A1c MFr Bld: 5.8 % (ref 4.6–6.5)

## 2019-05-17 LAB — LDL CHOLESTEROL, DIRECT: Direct LDL: 131 mg/dL

## 2019-05-22 ENCOUNTER — Encounter: Payer: Self-pay | Admitting: Medical

## 2019-05-23 ENCOUNTER — Telehealth: Payer: Self-pay | Admitting: Medical

## 2019-05-23 MED ORDER — ATORVASTATIN CALCIUM 10 MG PO TABS: 10.0000 mg | ORAL_TABLET | Freq: Every day | ORAL | 3 refills | Status: DC

## 2019-05-23 MED ORDER — PANTOPRAZOLE SODIUM 20 MG PO TBEC: 20.0000 mg | DELAYED_RELEASE_TABLET | Freq: Every day | ORAL | 3 refills | Status: DC

## 2019-05-23 MED ORDER — HYDROCHLOROTHIAZIDE 25 MG PO TABS: 25.0000 mg | ORAL_TABLET | Freq: Every day | ORAL | 1 refills | Status: DC

## 2019-05-23 NOTE — Telephone Encounter (Signed)
Rx protonix sent to pt pharmacy. ?

## 2019-05-23 NOTE — Telephone Encounter (Signed)
Rx atorvastatin and refilled hctz sent to pt pharmacy.

## 2019-05-26 ENCOUNTER — Ambulatory Visit (AMBULATORY_SURGERY_CENTER): Payer: Self-pay | Admitting: *Deleted

## 2019-05-26 ENCOUNTER — Other Ambulatory Visit: Payer: Self-pay

## 2019-05-26 VITALS — Temp 97.8°F | Ht 62.0 in | Wt 246.2 lb

## 2019-05-26 DIAGNOSIS — Z1211 Encounter for screening for malignant neoplasm of colon: Secondary | ICD-10-CM

## 2019-05-26 DIAGNOSIS — Z800 Family history of malignant neoplasm of digestive organs: Secondary | ICD-10-CM

## 2019-05-26 DIAGNOSIS — Z01818 Encounter for other preprocedural examination: Secondary | ICD-10-CM

## 2019-05-26 NOTE — Progress Notes (Signed)
Pt has severe PONV  Suprep sample given.  Pt is aware that care partner will wait in the car during procedure; if they feel like they will be too hot or cold to wait in the car; they may wait in the 4 th floor lobby. Patient is aware to bring only one care partner. We want them to wear a mask (we do not have any that we can provide them), practice social distancing, and we will check their temperatures when they get here.  I did remind the patient that their care partner needs to stay in the parking lot the entire time and have a cell phone available, we will call them when the pt is ready for discharge. Patient will wear mask into building.  No egg or soy allergy  No home oxygen use   No medications for weight loss taken  emmi information given  covid test 05-31-19 at 2:30 p.m.

## 2019-05-31 ENCOUNTER — Ambulatory Visit: Payer: Self-pay | Admitting: Medical

## 2019-05-31 ENCOUNTER — Other Ambulatory Visit: Payer: Self-pay

## 2019-06-01 ENCOUNTER — Telehealth: Payer: Self-pay

## 2019-06-01 NOTE — Telephone Encounter (Signed)
Pt is scheduled for colon procedure with Dr. Barron Alvine on 06/03/19. Pt no showed covid test scheduled on 05/31/19. Was unable to reach pt. I left a voicemail to have pt call office. Pt will need to be rescheduled to have covid testing done before procedure.

## 2019-06-02 ENCOUNTER — Telehealth: Payer: Self-pay

## 2019-06-02 NOTE — Telephone Encounter (Signed)
Tried to call pt again in regards to covid test. Pt no showed covid test on 05/31/19. She is scheduled for a colon with Dr. Barron Alvine on 06/03/19 at 1430. I was unable to reach pt. I left a voicemail to have pt call office back. Pt will need to reschedule covid test, and procedure.

## 2019-06-03 ENCOUNTER — Encounter: Payer: Self-pay | Admitting: Gastroenterology

## 2019-07-22 ENCOUNTER — Ambulatory Visit: Payer: Self-pay

## 2020-12-28 ENCOUNTER — Other Ambulatory Visit: Payer: Self-pay

## 2020-12-28 ENCOUNTER — Ambulatory Visit (INDEPENDENT_AMBULATORY_CARE_PROVIDER_SITE_OTHER): Payer: Self-pay | Admitting: Medical

## 2020-12-28 VITALS — BP 142/84 | HR 91 | Temp 98.3°F | Resp 18 | Ht 62.0 in | Wt 240.0 lb

## 2020-12-28 DIAGNOSIS — L732 Hidradenitis suppurativa: Secondary | ICD-10-CM

## 2020-12-28 DIAGNOSIS — I10 Essential (primary) hypertension: Secondary | ICD-10-CM

## 2020-12-28 DIAGNOSIS — J3489 Other specified disorders of nose and nasal sinuses: Secondary | ICD-10-CM

## 2020-12-28 DIAGNOSIS — F419 Anxiety disorder, unspecified: Secondary | ICD-10-CM

## 2020-12-28 DIAGNOSIS — S46811A Strain of other muscles, fascia and tendons at shoulder and upper arm level, right arm, initial encounter: Secondary | ICD-10-CM

## 2020-12-28 DIAGNOSIS — R739 Hyperglycemia, unspecified: Secondary | ICD-10-CM

## 2020-12-28 DIAGNOSIS — G44209 Tension-type headache, unspecified, not intractable: Secondary | ICD-10-CM

## 2020-12-28 DIAGNOSIS — R5383 Other fatigue: Secondary | ICD-10-CM

## 2020-12-28 DIAGNOSIS — E7849 Other hyperlipidemia: Secondary | ICD-10-CM

## 2020-12-28 DIAGNOSIS — D649 Anemia, unspecified: Secondary | ICD-10-CM

## 2020-12-28 LAB — CBC WITH DIFFERENTIAL/PLATELET
Basophils Absolute: 0.1 10*3/uL (ref 0.0–0.1)
Basophils Relative: 0.8 % (ref 0.0–3.0)
Eosinophils Absolute: 0.6 10*3/uL (ref 0.0–0.7)
Eosinophils Relative: 7.7 % — ABNORMAL HIGH (ref 0.0–5.0)
HCT: 34.8 % — ABNORMAL LOW (ref 36.0–46.0)
Hemoglobin: 11.2 g/dL — ABNORMAL LOW (ref 12.0–15.0)
Lymphocytes Relative: 18.4 % (ref 12.0–46.0)
Lymphs Abs: 1.4 10*3/uL (ref 0.7–4.0)
MCHC: 32.2 g/dL (ref 30.0–36.0)
MCV: 72.2 fl — ABNORMAL LOW (ref 78.0–100.0)
Monocytes Absolute: 0.6 10*3/uL (ref 0.1–1.0)
Monocytes Relative: 7.4 % (ref 3.0–12.0)
Neutro Abs: 5.1 10*3/uL (ref 1.4–7.7)
Neutrophils Relative %: 65.7 % (ref 43.0–77.0)
Platelets: 303 10*3/uL (ref 150.0–400.0)
RBC: 4.82 Mil/uL (ref 3.87–5.11)
RDW: 18.2 % — ABNORMAL HIGH (ref 11.5–15.5)
WBC: 7.8 10*3/uL (ref 4.0–10.5)

## 2020-12-28 LAB — COMPREHENSIVE METABOLIC PANEL
ALT: 16 U/L (ref 0–35)
AST: 13 U/L (ref 0–37)
Albumin: 4 g/dL (ref 3.5–5.2)
Alkaline Phosphatase: 63 U/L (ref 39–117)
BUN: 19 mg/dL (ref 6–23)
CO2: 25 mEq/L (ref 19–32)
Calcium: 9.8 mg/dL (ref 8.4–10.5)
Chloride: 107 mEq/L (ref 96–112)
Creatinine, Ser: 0.71 mg/dL (ref 0.40–1.20)
GFR: 96.78 mL/min (ref >60.00)
Glucose, Bld: 87 mg/dL (ref 70–99)
Potassium: 4.6 mEq/L (ref 3.5–5.1)
Sodium: 140 mEq/L (ref 135–145)
Total Bilirubin: 0.4 mg/dL (ref 0.2–1.2)
Total Protein: 7.3 g/dL (ref 6.0–8.3)

## 2020-12-28 LAB — LIPID PANEL
Cholesterol: 183 mg/dL (ref 0–200)
HDL: 54.9 mg/dL (ref >39.00)
LDL Cholesterol: 103 mg/dL — ABNORMAL HIGH (ref 0–99)
NonHDL: 127.87
Total CHOL/HDL Ratio: 3
Triglycerides: 126 mg/dL (ref 0.0–149.0)
VLDL: 25.2 mg/dL (ref 0.0–40.0)

## 2020-12-28 LAB — HEMOGLOBIN A1C: Hgb A1c MFr Bld: 6 % (ref 4.6–6.5)

## 2020-12-28 LAB — VITAMIN B12: Vitamin B-12: 592 pg/mL (ref 211–911)

## 2020-12-28 LAB — TSH: TSH: 1.32 u[IU]/mL (ref 0.35–5.50)

## 2020-12-28 LAB — IRON: Iron: 23 ug/dL — ABNORMAL LOW (ref 42–145)

## 2020-12-28 LAB — T4, FREE: Free T4: 1.04 ng/dL (ref 0.60–1.60)

## 2020-12-28 MED ORDER — ATORVASTATIN CALCIUM 10 MG PO TABS: 10.0000 mg | ORAL_TABLET | Freq: Every day | ORAL | 3 refills | Status: DC

## 2020-12-28 MED ORDER — VENLAFAXINE HCL ER 150 MG PO CP24: 150.0000 mg | ORAL_CAPSULE | Freq: Every day | ORAL | 5 refills | Status: DC

## 2020-12-28 MED ORDER — DOXYCYCLINE HYCLATE 100 MG PO TABS: 100.0000 mg | ORAL_TABLET | Freq: Two times a day (BID) | ORAL | 0 refills | Status: DC

## 2020-12-28 MED ORDER — CHLORTHALIDONE 25 MG PO TABS: 25.0000 mg | ORAL_TABLET | Freq: Every day | ORAL | 11 refills | Status: DC

## 2020-12-28 MED ORDER — METFORMIN HCL 500 MG PO TABS: 500.0000 mg | ORAL_TABLET | Freq: Two times a day (BID) | ORAL | 3 refills | Status: DC

## 2020-12-28 MED ORDER — CYCLOBENZAPRINE HCL 10 MG PO TABS: 10.0000 mg | ORAL_TABLET | Freq: Two times a day (BID) | ORAL | 0 refills | Status: DC | PRN

## 2020-12-28 NOTE — Patient Instructions (Signed)
For anxiety and stress refilling your Effexor.  For hypertension prescribing chlorthalidone  For sinus pressure and history of sinus infection prescribing Flonase nasal spray and doxycycline antibiotic.  For recent skin infection groin area doxycycline should help as well.  For fatigue placing fatigue labs.  For diabetes and high cholesterol ordered CMP and A1c.  Continue metformin and cholesterol medication.  For recent stress and history of anxiety refilling Effexor  I do think you probably had tension headache 1 week ago.  Making Flexeril was available to use as similar headache occurs with neck pain.  Recommend using Flexeril at night in combination with low-dose ibuprofen 200mg .  Follow-up in 3 to 4 weeks or sooner if needed.

## 2020-12-28 NOTE — Addendum Note (Signed)
Addended by: Gwenevere Abbot on: 12/28/2020 08:49 PM   Modules accepted: Orders

## 2020-12-28 NOTE — Progress Notes (Signed)
   Subjective:    Patient ID: Kristin Wong, female    DOB: 07/15/1965, 55 y.o.   MRN: [id number]  HPI  Pt in for evaluation. Last seen in January.   Last note had htn, elevated bs and anxiety.  Asked to follow up in 2 weeks to discuss mood. Follow on bp and elevated sugar in 3-6 months.   History of hypertension.  High levels at home similar to today's readings.  No gross motor or sensory function deficits.  Lost insurance and has not been on medication for months.  History of diabetes and has not been on her metformin.  Sinus pressure nasal congestion for 1 day.  History of sinus infections.  Recent skin irritation/infection.  She is seeing her dermatologist in 1 week.  History of hidradenitis as well.  Recent fatigue for months but worse over the last 6 weeks.  High-level stress recently since her mother passed about 6 months ago and her middle child was sick for 2 weeks/needed to be hospitalized.     Review of Systems See HPI    Objective:   Physical Exam   General Mental Status- Alert. General Appearance- Not in acute distress.   Skin General: Color- Normal Color. Moisture- Normal Moisture.  Neck Carotid Arteries- Normal color. Moisture- Normal Moisture. No carotid bruits. No JVD.  Chest and Lung Exam Auscultation: Breath Sounds:-Normal.  Cardiovascular Auscultation:Rythm- Regular. Murmurs & Other Heart Sounds:Auscultation of the heart reveals- No Murmurs.  Abdomen Inspection:-Inspeection Normal. Palpation/Percussion:Note:No mass. Palpation and Percussion of the abdomen reveal- Non Tender, Non Distended + BS, no rebound or guarding.    Neurologic Cranial Nerve exam:- CN III-XII intact(No nystagmus), symmetric smile. Drift Test:- No drift. Romberg Exam:- Negative.  Heal to Toe Gait exam:-Normal. Finger to Nose:- Normal/Intact Strength:- 5/5 equal and symmetric strength both upper and lower extremities.   HEENT-mild frontal sinus pressure  palpation.     Assessment & Plan:   Patient Instructions  For anxiety and stress refilling your Effexor.  For hypertension prescribing chlorthalidone  For sinus pressure and history of sinus infection prescribing Flonase nasal spray and doxycycline antibiotic.  For recent skin infection groin area doxycycline should help as well.  For fatigue placing fatigue labs.  For diabetes and high cholesterol ordered CMP and A1c.  Continue metformin and cholesterol medication.  For recent stress and history of anxiety refilling Effexor  I do think you probably had tension headache 1 week ago.  Making Flexeril was available to use as similar headache occurs with neck pain.  Recommend using Flexeril at night in combination with low-dose ibuprofen 200mg .  Follow-up in 3 to 4 weeks or sooner if needed.    , PA-C

## 2021-01-01 LAB — VITAMIN D 1,25 DIHYDROXY
Vitamin D 1, 25 (OH)2 Total: 42 pg/mL (ref 18–72)
Vitamin D2 1, 25 (OH)2: <8 pg/mL
Vitamin D3 1, 25 (OH)2: 42 pg/mL

## 2021-01-03 LAB — VITAMIN B1: Vitamin B1 (Thiamine): 10 nmol/L (ref 8–30)

## 2021-01-08 ENCOUNTER — Encounter: Payer: Self-pay | Admitting: Medical

## 2021-01-09 NOTE — Addendum Note (Signed)
Addended by: Mervin Kung A on: 01/09/2021 12:43 PM   Modules accepted: Orders

## 2021-01-11 ENCOUNTER — Telehealth: Payer: Self-pay | Admitting: *Deleted

## 2021-01-11 ENCOUNTER — Telehealth: Payer: Self-pay | Admitting: Medical

## 2021-01-11 ENCOUNTER — Encounter: Payer: Self-pay | Admitting: Medical

## 2021-01-11 ENCOUNTER — Other Ambulatory Visit (INDEPENDENT_AMBULATORY_CARE_PROVIDER_SITE_OTHER): Payer: Self-pay

## 2021-01-11 DIAGNOSIS — Z1211 Encounter for screening for malignant neoplasm of colon: Secondary | ICD-10-CM

## 2021-01-11 DIAGNOSIS — D649 Anemia, unspecified: Secondary | ICD-10-CM

## 2021-01-11 DIAGNOSIS — K921 Melena: Secondary | ICD-10-CM

## 2021-01-11 LAB — FECAL OCCULT BLOOD, IMMUNOCHEMICAL: Fecal Occult Bld: POSITIVE — AB

## 2021-01-11 NOTE — Telephone Encounter (Signed)
Chart opened to rx med, order lab, review chart, respond to my chart message or send message to staff member  

## 2021-01-11 NOTE — Telephone Encounter (Signed)
Received call from [facility] lab with positive IFOB. Please advise?

## 2021-01-14 ENCOUNTER — Telehealth: Payer: Self-pay | Admitting: Medical

## 2021-01-14 MED ORDER — IRON (FERROUS SULFATE) 325 (65 FE) MG PO TABS: ORAL_TABLET | ORAL | 1 refills | Status: DC

## 2021-01-14 NOTE — Telephone Encounter (Signed)
Chart opened to rx med, order lab, review chart, respond to my chart message or send message to staff member  

## 2021-01-15 ENCOUNTER — Encounter: Payer: Self-pay | Admitting: Medical

## 2021-01-21 ENCOUNTER — Other Ambulatory Visit: Payer: Self-pay

## 2021-01-21 ENCOUNTER — Other Ambulatory Visit (INDEPENDENT_AMBULATORY_CARE_PROVIDER_SITE_OTHER): Payer: Self-pay

## 2021-01-21 DIAGNOSIS — D649 Anemia, unspecified: Secondary | ICD-10-CM

## 2021-01-21 LAB — CBC WITH DIFFERENTIAL/PLATELET
Basophils Absolute: 0.1 10*3/uL (ref 0.0–0.1)
Basophils Relative: 1.1 % (ref 0.0–3.0)
Eosinophils Absolute: 0.5 10*3/uL (ref 0.0–0.7)
Eosinophils Relative: 6.6 % — ABNORMAL HIGH (ref 0.0–5.0)
HCT: 38.9 % (ref 36.0–46.0)
Hemoglobin: 12.3 g/dL (ref 12.0–15.0)
Lymphocytes Relative: 20.6 % (ref 12.0–46.0)
Lymphs Abs: 1.7 10*3/uL (ref 0.7–4.0)
MCHC: 31.7 g/dL (ref 30.0–36.0)
MCV: 73.6 fl — ABNORMAL LOW (ref 78.0–100.0)
Monocytes Absolute: 0.7 10*3/uL (ref 0.1–1.0)
Monocytes Relative: 8.5 % (ref 3.0–12.0)
Neutro Abs: 5.1 10*3/uL (ref 1.4–7.7)
Neutrophils Relative %: 63.2 % (ref 43.0–77.0)
Platelets: 369 10*3/uL (ref 150.0–400.0)
RBC: 5.28 Mil/uL — ABNORMAL HIGH (ref 3.87–5.11)
RDW: 20.6 % — ABNORMAL HIGH (ref 11.5–15.5)
WBC: 8.1 10*3/uL (ref 4.0–10.5)

## 2021-01-21 LAB — IRON: Iron: 51 ug/dL (ref 42–145)

## 2021-02-05 ENCOUNTER — Other Ambulatory Visit: Payer: Self-pay

## 2021-02-05 ENCOUNTER — Encounter: Payer: Self-pay | Admitting: Gastroenterology

## 2021-02-05 ENCOUNTER — Ambulatory Visit (INDEPENDENT_AMBULATORY_CARE_PROVIDER_SITE_OTHER): Payer: Self-pay | Admitting: Gastroenterology

## 2021-02-05 VITALS — BP 150/94 | HR 88 | Ht 62.0 in | Wt 234.0 lb

## 2021-02-05 DIAGNOSIS — D509 Iron deficiency anemia, unspecified: Secondary | ICD-10-CM

## 2021-02-05 DIAGNOSIS — R630 Anorexia: Secondary | ICD-10-CM

## 2021-02-05 DIAGNOSIS — K219 Gastro-esophageal reflux disease without esophagitis: Secondary | ICD-10-CM

## 2021-02-05 DIAGNOSIS — K921 Melena: Secondary | ICD-10-CM

## 2021-02-05 DIAGNOSIS — R198 Other specified symptoms and signs involving the digestive system and abdomen: Secondary | ICD-10-CM

## 2021-02-05 DIAGNOSIS — R1012 Left upper quadrant pain: Secondary | ICD-10-CM

## 2021-02-05 DIAGNOSIS — R1013 Epigastric pain: Secondary | ICD-10-CM

## 2021-02-05 DIAGNOSIS — L732 Hidradenitis suppurativa: Secondary | ICD-10-CM

## 2021-02-05 DIAGNOSIS — R140 Abdominal distension (gaseous): Secondary | ICD-10-CM

## 2021-02-05 NOTE — Patient Instructions (Signed)
If you are age 54 or older, your body mass index should be between 23-30. Your Body mass index is 42.8 kg/m. If this is out of the aforementioned range listed, please consider follow up with your Primary Care Provider.  If you are age 53 or younger, your body mass index should be between 19-25. Your Body mass index is 42.8 kg/m. If this is out of the aformentioned range listed, please consider follow up with your Primary Care Provider.   __________________________________________________________  The [facility] providers would like to encourage you to use [facility] to communicate with providers for non-urgent requests or questions.  Due to long hold times on the telephone, sending your provider a message by [facility] may be a faster and more efficient way to get a response.  Please allow 48 business hours for a response.  Please remember that this is for non-urgent requests.   Due to recent changes in healthcare laws, you may see the results of your imaging and laboratory studies on MyChart before your provider has had a chance to review them.  We understand that in some cases there may be results that are confusing or concerning to you. Not all laboratory results come back in the same time frame and the provider may be waiting for multiple results in order to interpret others.  Please give [place] 48 hours in order for your provider to thoroughly review all the results before contacting the office for clarification of your results.   You have been given a sample of Clenpiq for your colonoscopy. Please follow the directions printed for you not what is on the box.  Thank you for choosing me and [facility].  Vito Cirigliano, D.O.

## 2021-02-05 NOTE — Progress Notes (Signed)
Chief Complaint: Colon cancer screening, iron deficiency anemia, heme positive stool, fatigue, abdominal pain, change in bowel habits   Referring Provider:     Mackie Pai, PA-C    HPI:     Kristin Wong is a 55 y.o. female with a history of HTN, anxiety, diabetes, HLD, arthritis, GERD, obesity (BMI 44), hidradenitis  s/p axillary surgery 2007, cholecystectomy 2002, referred to the [facility] for evaluation of iron deficiency anemia, heme positive stool, fatigue, along with abdominal pain, change in bowel habits, and discussed initial colon cancer screening.  - 12/28/2020: H/H 11.2/34.8, MCV/RDW 72/18.  Iron 23.  Normal B12, vitamin D, vitamin B1.  Normal CMP.  Started on oral iron - 01/11/2021: FOBT positive - 01/21/2021: H/H 12.3/38.9, MCV/RDW 73.6/20.6.  Iron 51  She states she has had fatigue for the last 4-5 months, then started developing GI symptoms for the last 4-5 weeks, described as LUQ/epigastric pain, increase gas/bloating, fatigue, and now mucus-like stools.  Decreased appetite. Symptoms have progressively worsened over the last few weeks. +post prandial abdominal cramping, worse with beef, carbohydrates, high fiber foods. Now with mucus-like, blood stools, with increased urgency, but no overall change in frequency. No nocturnal stools. No n/v/f/c.   No preceding changes in medications, Abx (currently on prolonged course of Doxy, but sxs predate that Rx), hospitalization, etc. Baseline is 4-5 BMs each AM.   Reports a history of "gastritis and ulcers when I was younger" diagnosed by EGD in teenage years.    Hx of GERD controlled with dietary mods and occasional Tums. Rarely uses Protonix.   No previous colonoscopy.  No recent abdominal imaging for review.  Sister with Colon Cancer, diagnosed ~age 45. Niece with UC. Father died with Lymphoma.     Past Medical History:  Diagnosis Date   Allergy    Anxiety    Arthritis    mild intermittent joint  pain. Rt knee and left hip worst.   Diabetes mellitus without complication ([place])    "pre diabetic level" on Metformin   Environmental allergies    Frequent headaches    GERD (gastroesophageal reflux disease)    she states has heart burn alot.   Hidradenitis    History of chicken pox    Hyperlipidemia    Hypertension    Seasonal allergies    UTI (lower urinary tract infection)      Past Surgical History:  Procedure Laterality Date   AXILLARY SURGERY     Sweat glands removed, bilateral 2007   CHOLECYSTECTOMY     2002   TUBAL LIGATION     1996   WISDOM TOOTH EXTRACTION     Family History  Problem Relation Age of Onset   Hypertension Mother        Living   COPD Mother    Asthma Mother    Scoliosis Mother    Heart disease Father 70       Deceased   Hyperlipidemia Father    Lymphoma Father    COPD Father    Congestive Heart Failure Father    Heart attack Father    Anuerysm Father    Arthritis Father    Uterine cancer Maternal Grandmother    Hypertension Maternal Grandmother    Aneurysm Paternal Grandfather    Heart disease Paternal Grandfather    Lung cancer Maternal Grandfather    Diabetes Paternal Grandmother    COPD Other  Maternal Aunts & Uncles   Heart disease Other        Maternal Aunts & Uncles   Aneurysm Other        Paternal uncles x3   Aneurysm Brother        #1   Asthma Brother        #1   Diabetes Brother        #1   Asthma Brother    Breast cancer Sister    Colon cancer Sister 69   Cardiomyopathy Sister    Arthritis Sister    Lymphoma Son        [facility] Cell -- 5 years Remission   Scoliosis Daughter        x1   Healthy Son        #2   Esophageal cancer Neg Hx    Stomach cancer Neg Hx    Rectal cancer Neg Hx    Social History   Tobacco Use   Smoking status: Never   Smokeless tobacco: Never  Substance Use Topics   Alcohol use: Yes    Alcohol/week: 0.0 standard drinks    Comment: rare/occasional.   Drug use: No   Current  Outpatient Medications  Medication Sig Dispense Refill   atorvastatin (LIPITOR) 10 MG tablet Take 1 tablet (10 mg total) by mouth daily. 30 tablet 3   azelastine (ASTELIN) 0.1 % nasal spray Place 2 sprays into both nostrils 2 (two) times daily. Use in each nostril as directed 30 mL 3   chlorthalidone (HYGROTON) 25 MG tablet Take 1 tablet (25 mg total) by mouth daily. 30 tablet 11   clonazePAM (KLONOPIN) 0.5 MG tablet Take 1 tablet by mouth twice daily as needed for anxiety 10 tablet 0   diphenhydrAMINE (BENADRYL) 25 mg capsule Take 25 mg by mouth at bedtime. Reported on 07/03/2015     doxycycline (VIBRA-TABS) 100 MG tablet Take 1 tablet (100 mg total) by mouth 2 (two) times daily. 14 tablet 0   hydrOXYzine (ATARAX/VISTARIL) 25 MG tablet Take 1 tablet (25 mg total) by mouth every 8 (eight) hours as needed for itching. 30 tablet 0   Iron, Ferrous Sulfate, 325 (65 Fe) MG TABS 1 tab po bid 60 tablet 1   Multiple Vitamin (MULTIVITAMIN) tablet Take 1 tablet by mouth daily. Reported on 07/03/2015     NEOMYCIN-POLYMYXIN-HYDROCORTISONE (CORTISPORIN) 1 % SOLN OTIC solution Place 3 drops into the right ear every 6 (six) hours. 10 mL 0   pantoprazole (PROTONIX) 20 MG tablet Take 1 tablet (20 mg total) by mouth daily. 30 tablet 3   cyclobenzaprine (FLEXERIL) 10 MG tablet Take 1 tablet (10 mg total) by mouth 2 (two) times daily as needed for muscle spasms. (Patient not taking: Reported on 02/05/2021) 21 tablet 0   metFORMIN (GLUCOPHAGE) 500 MG tablet Take 1 tablet (500 mg total) by mouth 2 (two) times daily with a meal. (Patient not taking: Reported on 02/05/2021) 60 tablet 3   venlafaxine XR (EFFEXOR XR) 150 MG 24 hr capsule Take 1 capsule (150 mg total) by mouth daily with breakfast. (Patient not taking: Reported on 02/05/2021) 30 capsule 5   No current facility-administered medications for this visit.   Allergies  Allergen Reactions   Anesthetics, Ester Nausea And Vomiting   Penicillins Hives   Morphine And  Related Anxiety and Nausea And Vomiting     Review of Systems: All systems reviewed and negative except where noted in HPI.     Physical Exam:  Wt Readings from Last 3 Encounters:  02/05/21 234 lb (106.1 kg)  12/28/20 240 lb (108.9 kg)  05/26/19 246 lb 3.2 oz (111.7 kg)    BP (!) 150/94   Pulse 88   Ht 5' 2"  (1.575 m)   Wt 234 lb (106.1 kg)   SpO2 98%   BMI 42.80 kg/m  Constitutional:  Pleasant, in no acute distress. Psychiatric: Normal mood and affect. Behavior is normal. Cardiovascular: Normal rate, regular rhythm. No edema Pulmonary/chest: Effort normal and breath sounds normal. No wheezing, rales or rhonchi. Abdominal: Mild generalized TTP without rebound or guarding.  No peritoneal signs.  Soft, nondistended. Bowel sounds active throughout. There are no masses palpable. No hepatomegaly. Neurological: Alert and oriented to person place and time. Skin: Skin is warm and dry. No rashes noted.   ASSESSMENT AND PLAN;   1) Change in bowel habits 2) Hematochezia 3) Heme positive stool 4) Iron deficiency anemia 5) Epigastric/LUQ pain 6) Decreased appetite 7) Fecal urgency 8) Bloating 9) Fatigue 10) History of hidradenitis suppurativa  - EGD and colonoscopy to evaluate for mucosal/luminal pathology with random and directed biopsies - Check GI PCR panel and fecal calprotectin - Holding off on checking ESR/CRP as these would likely be elevated given active hidradenitis suppurativa flare - Abx for HS per Dermatology - Continue oral iron as currently taking.  Hold prior to bowel prep for colonoscopy  11) GERD - Well-controlled with dietary modifications and rare use of Tums - Evaluate for erosive esophagitis, LES laxity, hiatal hernia time of EGD as above  The indications, risks, and benefits of EGD and colonoscopy were explained to the patient in detail. Risks include but are not limited to bleeding, perforation, adverse reaction to medications, and cardiopulmonary  compromise. Sequelae include but are not limited to the possibility of surgery, hospitalization, and mortality. The patient verbalized understanding and wished to proceed. All questions answered, referred to scheduler and bowel prep ordered. Further recommendations pending results of the exam.      Lavena Bullion, DO, FACG  02/05/2021, 8:36 AM   Saguier, Percell Miller, PA-C

## 2021-02-06 ENCOUNTER — Other Ambulatory Visit: Payer: Self-pay

## 2021-02-06 DIAGNOSIS — D509 Iron deficiency anemia, unspecified: Secondary | ICD-10-CM

## 2021-02-06 DIAGNOSIS — R198 Other specified symptoms and signs involving the digestive system and abdomen: Secondary | ICD-10-CM

## 2021-02-06 DIAGNOSIS — K921 Melena: Secondary | ICD-10-CM

## 2021-02-08 ENCOUNTER — Encounter: Payer: Self-pay | Admitting: Gastroenterology

## 2021-02-08 ENCOUNTER — Ambulatory Visit (AMBULATORY_SURGERY_CENTER): Payer: Self-pay | Admitting: Gastroenterology

## 2021-02-08 ENCOUNTER — Other Ambulatory Visit: Payer: Self-pay

## 2021-02-08 ENCOUNTER — Other Ambulatory Visit (INDEPENDENT_AMBULATORY_CARE_PROVIDER_SITE_OTHER): Payer: Self-pay

## 2021-02-08 VITALS — BP 154/88 | HR 65 | Temp 98.9°F | Resp 16 | Ht 62.0 in | Wt 234.0 lb

## 2021-02-08 DIAGNOSIS — K259 Gastric ulcer, unspecified as acute or chronic, without hemorrhage or perforation: Secondary | ICD-10-CM

## 2021-02-08 DIAGNOSIS — K2980 Duodenitis without bleeding: Secondary | ICD-10-CM

## 2021-02-08 DIAGNOSIS — K2981 Duodenitis with bleeding: Secondary | ICD-10-CM

## 2021-02-08 DIAGNOSIS — K269 Duodenal ulcer, unspecified as acute or chronic, without hemorrhage or perforation: Secondary | ICD-10-CM

## 2021-02-08 DIAGNOSIS — Z800 Family history of malignant neoplasm of digestive organs: Secondary | ICD-10-CM

## 2021-02-08 DIAGNOSIS — K921 Melena: Secondary | ICD-10-CM

## 2021-02-08 DIAGNOSIS — D509 Iron deficiency anemia, unspecified: Secondary | ICD-10-CM

## 2021-02-08 DIAGNOSIS — K319 Disease of stomach and duodenum, unspecified: Secondary | ICD-10-CM

## 2021-02-08 DIAGNOSIS — K209 Esophagitis, unspecified without bleeding: Secondary | ICD-10-CM

## 2021-02-08 DIAGNOSIS — K50118 Crohn's disease of large intestine with other complication: Secondary | ICD-10-CM

## 2021-02-08 DIAGNOSIS — K219 Gastro-esophageal reflux disease without esophagitis: Secondary | ICD-10-CM

## 2021-02-08 DIAGNOSIS — K2970 Gastritis, unspecified, without bleeding: Secondary | ICD-10-CM

## 2021-02-08 DIAGNOSIS — K2100 Gastro-esophageal reflux disease with esophagitis, without bleeding: Secondary | ICD-10-CM

## 2021-02-08 HISTORY — PX: COLONOSCOPY: SHX174

## 2021-02-08 LAB — SEDIMENTATION RATE: Sed Rate: 59 mm/hr — ABNORMAL HIGH (ref 0–30)

## 2021-02-08 LAB — HIGH SENSITIVITY CRP: CRP, High Sensitivity: 91.43 mg/L — ABNORMAL HIGH (ref 0.000–5.000)

## 2021-02-08 MED ORDER — SODIUM CHLORIDE 0.9 % IV SOLN: 500.0000 mL | Freq: Once | INTRAVENOUS | Status: DC

## 2021-02-08 MED ORDER — PREDNISONE 10 MG PO TABS: 10.0000 mg | ORAL_TABLET | Freq: Every day | ORAL | 0 refills | Status: DC

## 2021-02-08 MED ORDER — PANTOPRAZOLE SODIUM 40 MG PO TBEC: 40.0000 mg | DELAYED_RELEASE_TABLET | Freq: Two times a day (BID) | ORAL | 3 refills | Status: DC

## 2021-02-08 NOTE — Progress Notes (Signed)
Report given to PACU, vss 

## 2021-02-08 NOTE — Patient Instructions (Signed)
Lab work ordered for today. Repeat colonoscopy in 6 months, to be scheduled by office. Pick up new medicine protonix and prednisone.    YOU HAD AN ENDOSCOPIC PROCEDURE TODAY AT THE [facility]:   Refer to the procedure report that was given to you for any specific questions about what was found during the examination.  If the procedure report does not answer your questions, please call your gastroenterologist to clarify.  If you requested that your care partner not be given the details of your procedure findings, then the procedure report has been included in a sealed envelope for you to review at your convenience later.  YOU SHOULD EXPECT: Some feelings of bloating in the abdomen. Passage of more gas than usual.  Walking can help get rid of the air that was put into your GI tract during the procedure and reduce the bloating. If you had a lower endoscopy (such as a colonoscopy or flexible sigmoidoscopy) you may notice spotting of blood in your stool or on the toilet paper. If you underwent a bowel prep for your procedure, you may not have a normal bowel movement for a few days.  Please Note:  You might notice some irritation and congestion in your nose or some drainage.  This is from the oxygen used during your procedure.  There is no need for concern and it should clear up in a day or so.  SYMPTOMS TO REPORT IMMEDIATELY:  Following lower endoscopy (colonoscopy or flexible sigmoidoscopy):  Excessive amounts of blood in the stool  Significant tenderness or worsening of abdominal pains  Swelling of the abdomen that is new, acute  Fever of 100F or higher  Following upper endoscopy (EGD)  Vomiting of blood or coffee ground material  New chest pain or pain under the shoulder blades  Painful or persistently difficult swallowing  New shortness of breath  Fever of 100F or higher  Black, tarry-looking stools  For urgent or emergent issues, a gastroenterologist can be reached at any  hour by calling [phone]. Do not use MyChart messaging for urgent concerns.    DIET:  We do recommend a small meal at first, but then you may proceed to your regular diet.  Drink plenty of fluids but you should avoid alcoholic beverages for 24 hours.  ACTIVITY:  You should plan to take it easy for the rest of today and you should NOT DRIVE or use heavy machinery until tomorrow (because of the sedation medicines used during the test).    FOLLOW UP: Our staff will call the number listed on your records 48-72 hours following your procedure to check on you and address any questions or concerns that you may have regarding the information given to you following your procedure. If we do not reach you, we will leave a message.  We will attempt to reach you two times.  During this call, we will ask if you have developed any symptoms of COVID 19. If you develop any symptoms (ie: fever, flu-like symptoms, shortness of breath, cough etc.) before then, please call [phone].  If you test positive for Covid 19 in the 2 weeks post procedure, please call and report this information to [place].    If any biopsies were taken you will be contacted by phone or by letter within the next 1-3 weeks.  Please call us at [phone] if you have not heard about the biopsies in 3 weeks.    SIGNATURES/CONFIDENTIALITY: You and/or your care partner have signed  paperwork which will be entered into your electronic medical record.  These signatures attest to the fact that that the information above on your After Visit Summary has been reviewed and is understood.  Full responsibility of the confidentiality of this discharge information lies with you and/or your care-partner.

## 2021-02-08 NOTE — Progress Notes (Signed)
Robinul 0.1 mg IV given due large amount of secretions upon assessment.  Patient experiencing nausea with all types of anesthesia and requested Zofran..  MD updated and Zofran 4 mg IV given, vss

## 2021-02-08 NOTE — Op Note (Signed)
[facility] Patient Name: Kristin Wong Procedure Date: 02/08/2021 1:16 PM MRN: [id number] Endoscopist: Doristine Locks , MD Age: 55 Referring MD:  Date of Birth: Sep 08, 1965 Gender: Female Account #: [id number] Procedure:                Upper GI endoscopy Indications:              Epigastric abdominal pain, Abdominal pain in the                            left upper quadrant, Iron deficiency anemia, Heme                            positive stool, Diarrhea Medicines:                Monitored Anesthesia Care Procedure:                Pre-Anesthesia Assessment:                           - Prior to the procedure, a History and Physical                            was performed, and patient medications and                            allergies were reviewed. The patient's tolerance of                            previous anesthesia was also reviewed. The risks                            and benefits of the procedure and the sedation                            options and risks were discussed with the patient.                            All questions were answered, and informed consent                            was obtained. Prior Anticoagulants: The patient has                            taken no previous anticoagulant or antiplatelet                            agents. ASA Grade Assessment: II - A patient with                            mild systemic disease. After reviewing the risks                            and benefits, the patient was deemed in  satisfactory condition to undergo the procedure.                           After obtaining informed consent, the endoscope was                            passed under direct vision. Throughout the                            procedure, the patient's blood pressure, pulse, and                            oxygen saturations were monitored continuously. The                            GIF [id number] #[id number] was  introduced through the                            mouth, and advanced to the second part of duodenum.                            The upper GI endoscopy was accomplished without                            difficulty. The patient tolerated the procedure                            well. Scope In: Scope Out: Findings:                 LA Grade A (one or more mucosal breaks less than 5                            mm, not extending between tops of 2 mucosal folds)                            esophagitis with no bleeding was found in the lower                            third of the esophagus.                           The upper third of the esophagus and middle third                            of the esophagus were normal.                           Localized moderate inflammation characterized by                            congestion (edema), erythema and shallow                            ulcerations was found  in the gastric antrum.                            Biopsies were taken with a cold forceps for                            histology. Estimated blood loss was minimal.                           Localized moderate inflammation characterized by                            congestion (edema), erythema and shallow                            ulcerations was found in the duodenal bulb.                            Biopsies were taken with a cold forceps for                            histology. Estimated blood loss was minimal.                           The second portion of the duodenum was normal.                            Biopsies were taken with a cold forceps for                            histology. Estimated blood loss was minimal. Complications:            No immediate complications. Estimated Blood Loss:     Estimated blood loss was minimal. Impression:               - LA Grade A reflux esophagitis with no bleeding.                           - Normal upper third of esophagus and middle  third                            of esophagus.                           - Gastritis. Biopsied.                           - Duodenitis. Biopsied.                           - Normal second portion of the duodenum. Biopsied. Recommendation:           - Patient has a contact number available for                            emergencies. The signs and symptoms of potential  delayed complications were discussed with the                            patient. Return to normal activities tomorrow.                            Written discharge instructions were provided to the                            patient.                           - Resume previous diet.                           - Continue present medications.                           - Await pathology results.                           - Use Protonix (pantoprazole) 40 mg PO BID for 8                            weeks, then reduce to 40 mg daily.                           - Perform a colonoscopy today. Doristine Locks, MD 02/08/2021 2:31:17 PM

## 2021-02-08 NOTE — Progress Notes (Signed)
VS by DT  Pt's states no medical or surgical changes since previsit or office visit.  

## 2021-02-08 NOTE — Progress Notes (Signed)
Called to room to assist during endoscopic procedure.  Patient ID and intended procedure confirmed with present staff. Received instructions for my participation in the procedure from the performing physician.  

## 2021-02-08 NOTE — Progress Notes (Signed)
GASTROENTEROLOGY PROCEDURE H&P NOTE   Primary Care Physician: Esperanza Richters, PA-C    Reason for Procedure:   Colon cancer screening, iron deficiency anemia, heme positive stool, fatigue, abdominal pain, change in bowel habits  Plan:    EGD, Colonsocopy  Patient is appropriate for endoscopic procedure(s) in the ambulatory ([facility]) setting.  The nature of the procedure, as well as the risks, benefits, and alternatives were carefully and thoroughly reviewed with the patient. Ample time for discussion and questions allowed. The patient understood, was satisfied, and agreed to proceed.     HPI: Kristin Wong is a 55 y.o. female who presents for EGD and colonoscopy for evaluation of CRC screening, IDA, FOBT+ stools, abdominal pain, fatigue and change in stools .  Patient was most recently seen in the Gastroenterology Clinic on 02/05/2021 by me.  No interval change in medical history since that appointment. Please refer to that note for full details regarding GI history and clinical presentation.   Past Medical History:  Diagnosis Date   Allergy    Anxiety    Arthritis    mild intermittent joint pain. Rt knee and left hip worst.   Diabetes mellitus without complication (HCC)    "pre diabetic level" on Metformin   Environmental allergies    Frequent headaches    GERD (gastroesophageal reflux disease)    she states has heart burn alot.   Hidradenitis    History of chicken pox    Hyperlipidemia    Hypertension    Seasonal allergies    UTI (lower urinary tract infection)     Past Surgical History:  Procedure Laterality Date   AXILLARY SURGERY     Sweat glands removed, bilateral 2007   CHOLECYSTECTOMY     2002   TUBAL LIGATION     1996   WISDOM TOOTH EXTRACTION      Prior to Admission medications   Medication Sig Start Date End Date Taking? Authorizing Provider  chlorthalidone (HYGROTON) 25 MG tablet Take 1 tablet (25 mg total) by mouth daily. 12/28/20  Yes Saguier, Ramon Dredge,  PA-C  doxycycline (VIBRA-TABS) 100 MG tablet Take 1 tablet (100 mg total) by mouth 2 (two) times daily. 12/28/20  Yes Saguier, Ramon Dredge, PA-C  Iron, Ferrous Sulfate, 325 (65 Fe) MG TABS 1 tab po bid 01/14/21  Yes Saguier, Ramon Dredge, PA-C  Multiple Vitamin (MULTIVITAMIN) tablet Take 1 tablet by mouth daily. Reported on 07/03/2015   Yes [provider]  atorvastatin (LIPITOR) 10 MG tablet Take 1 tablet (10 mg total) by mouth daily. Patient not taking: Reported on 02/08/2021 12/28/20   Saguier, Ramon Dredge, PA-C  azelastine (ASTELIN) 0.1 % nasal spray Place 2 sprays into both nostrils 2 (two) times daily. Use in each nostril as directed 04/30/18   Saguier, Ramon Dredge, PA-C  clonazePAM Scarlette Calico) 0.5 MG tablet Take 1 tablet by mouth twice daily as needed for anxiety 07/19/18   Saguier, Ramon Dredge, PA-C  cyclobenzaprine (FLEXERIL) 10 MG tablet Take 1 tablet (10 mg total) by mouth 2 (two) times daily as needed for muscle spasms. Patient not taking: No sig reported 12/28/20   Saguier, Ramon Dredge, PA-C  diphenhydrAMINE (BENADRYL) 25 mg capsule Take 25 mg by mouth at bedtime. Reported on 07/03/2015    [provider]  hydrOXYzine (ATARAX/VISTARIL) 25 MG tablet Take 1 tablet (25 mg total) by mouth every 8 (eight) hours as needed for itching. 11/23/18   Saguier, Ramon Dredge, PA-C  metFORMIN (GLUCOPHAGE) 500 MG tablet Take 1 tablet (500 mg total) by mouth 2 (two)  times daily with a meal. Patient not taking: No sig reported 12/28/20   Saguier, Ramon Dredge, PA-C  NEOMYCIN-POLYMYXIN-HYDROCORTISONE (CORTISPORIN) 1 % SOLN OTIC solution Place 3 drops into the right ear every 6 (six) hours. 05/21/18   Saguier, Ramon Dredge, PA-C  pantoprazole (PROTONIX) 20 MG tablet Take 1 tablet (20 mg total) by mouth daily. 05/23/19   Saguier, Ramon Dredge, PA-C  venlafaxine XR (EFFEXOR XR) 150 MG 24 hr capsule Take 1 capsule (150 mg total) by mouth daily with breakfast. Patient not taking: No sig reported 12/28/20   Esperanza Richters, PA-C    Current Outpatient Medications   Medication Sig Dispense Refill   chlorthalidone (HYGROTON) 25 MG tablet Take 1 tablet (25 mg total) by mouth daily. 30 tablet 11   doxycycline (VIBRA-TABS) 100 MG tablet Take 1 tablet (100 mg total) by mouth 2 (two) times daily. 14 tablet 0   Iron, Ferrous Sulfate, 325 (65 Fe) MG TABS 1 tab po bid 60 tablet 1   Multiple Vitamin (MULTIVITAMIN) tablet Take 1 tablet by mouth daily. Reported on 07/03/2015     atorvastatin (LIPITOR) 10 MG tablet Take 1 tablet (10 mg total) by mouth daily. (Patient not taking: Reported on 02/08/2021) 30 tablet 3   azelastine (ASTELIN) 0.1 % nasal spray Place 2 sprays into both nostrils 2 (two) times daily. Use in each nostril as directed 30 mL 3   clonazePAM (KLONOPIN) 0.5 MG tablet Take 1 tablet by mouth twice daily as needed for anxiety 10 tablet 0   cyclobenzaprine (FLEXERIL) 10 MG tablet Take 1 tablet (10 mg total) by mouth 2 (two) times daily as needed for muscle spasms. (Patient not taking: No sig reported) 21 tablet 0   diphenhydrAMINE (BENADRYL) 25 mg capsule Take 25 mg by mouth at bedtime. Reported on 07/03/2015     hydrOXYzine (ATARAX/VISTARIL) 25 MG tablet Take 1 tablet (25 mg total) by mouth every 8 (eight) hours as needed for itching. 30 tablet 0   metFORMIN (GLUCOPHAGE) 500 MG tablet Take 1 tablet (500 mg total) by mouth 2 (two) times daily with a meal. (Patient not taking: No sig reported) 60 tablet 3   NEOMYCIN-POLYMYXIN-HYDROCORTISONE (CORTISPORIN) 1 % SOLN OTIC solution Place 3 drops into the right ear every 6 (six) hours. 10 mL 0   pantoprazole (PROTONIX) 20 MG tablet Take 1 tablet (20 mg total) by mouth daily. 30 tablet 3   venlafaxine XR (EFFEXOR XR) 150 MG 24 hr capsule Take 1 capsule (150 mg total) by mouth daily with breakfast. (Patient not taking: No sig reported) 30 capsule 5   Current Facility-Administered Medications  Medication Dose Route Frequency Provider Last Rate Last Admin   0.9 %  sodium chloride infusion  500 mL Intravenous Once  Akshat Minehart V, DO        Allergies as of 02/08/2021 - Review Complete 02/08/2021  Allergen Reaction Noted   Anesthetics, ester Nausea And Vomiting 12/25/2014   Penicillins Hives 12/25/2014   Morphine and related Anxiety and Nausea And Vomiting 10/15/2010    Family History  Problem Relation Age of Onset   Hypertension Mother        Living   COPD Mother    Asthma Mother    Scoliosis Mother    Heart disease Father 61       Deceased   Hyperlipidemia Father    Lymphoma Father    COPD Father    Congestive Heart Failure Father    Heart attack Father    Anuerysm Father  Arthritis Father    Uterine cancer Maternal Grandmother    Hypertension Maternal Grandmother    Aneurysm Paternal Grandfather    Heart disease Paternal Grandfather    Lung cancer Maternal Grandfather    Diabetes Paternal Grandmother    COPD Other        Maternal Aunts & Uncles   Heart disease Other        Maternal Aunts & Uncles   Aneurysm Other        Paternal uncles x3   Aneurysm Brother        #1   Asthma Brother        #1   Diabetes Brother        #1   Asthma Brother    Breast cancer Sister    Colon cancer Sister 63   Cardiomyopathy Sister    Arthritis Sister    Lymphoma Son        Wallace Cullens Cell -- 5 years Remission   Scoliosis Daughter        x1   Healthy Son        #2   Esophageal cancer Neg Hx    Stomach cancer Neg Hx    Rectal cancer Neg Hx     Social History   Socioeconomic History   Marital status: Married    Spouse name: Not on file   Number of children: 3   Years of education: Not on file   Highest education level: Not on file  Occupational History   Occupation: Probation officer  Tobacco Use   Smoking status: Never   Smokeless tobacco: Never  Vaping Use   Vaping Use: Never used  Substance and Sexual Activity   Alcohol use: Yes    Alcohol/week: 0.0 standard drinks    Comment: rare/occasional.   Drug use: No   Sexual activity: Yes    Partners: Male  Other Topics  Concern   Not on file  Social History Narrative   Not on file   Social Determinants of Health   Financial Resource Strain: Not on file  Food Insecurity: Not on file  Transportation Needs: Not on file  Physical Activity: Not on file  Stress: Not on file  Social Connections: Not on file  Intimate Partner Violence: Not on file    Physical Exam: Vital signs in last 24 hours: @BP  (!) 140/93   Pulse 61   Temp 98.9 F (37.2 C) (Skin)   Resp 16   Ht 5\' 2"  (1.575 m)   Wt 234 lb (106.1 kg)   SpO2 100%   BMI 42.80 kg/m  GEN: NAD EYE: Sclerae anicteric ENT: MMM CV: Non-tachycardic Pulm: CTA b/l GI: Soft, NT/ND NEURO:  Alert & Oriented x 3   , DO [facility]   02/08/2021 1:39 PM

## 2021-02-08 NOTE — Op Note (Signed)
[facility] Patient Name: Kristin Wong Procedure Date: 02/08/2021 1:15 PM MRN: [id number] Endoscopist: Gerrit Heck , MD Age: 55 Referring MD:  Date of Birth: March 28, 1966 Gender: Female Account #: [id number] Procedure:                Colonoscopy Indications:              Epigastric abdominal pain, Abdominal pain in the                            left upper quadrant, Hematochezia, Heme positive                            stool, Iron deficiency anemia, Change in bowel                            habits, Diarrhea Medicines:                Monitored Anesthesia Care Procedure:                Pre-Anesthesia Assessment:                           - Prior to the procedure, a History and Physical                            was performed, and patient medications and                            allergies were reviewed. The patient's tolerance of                            previous anesthesia was also reviewed. The risks                            and benefits of the procedure and the sedation                            options and risks were discussed with the patient.                            All questions were answered, and informed consent                            was obtained. Prior Anticoagulants: The patient has                            taken no previous anticoagulant or antiplatelet                            agents. ASA Grade Assessment: II - A patient with                            mild systemic disease. After reviewing the risks  and benefits, the patient was deemed in                            satisfactory condition to undergo the procedure.                           After obtaining informed consent, the colonoscope                            was passed under direct vision. Throughout the                            procedure, the patient's blood pressure, pulse, and                            oxygen saturations were monitored continuously.  The                            CF [id number] #[id number] was introduced through the anus                            and advanced to the the terminal ileum. The                            colonoscopy was technically difficult and complex                            due to significant looping. Successful completion                            of the procedure was aided by applying abdominal                            pressure. The patient tolerated the procedure well.                            The quality of the bowel preparation was good. The                            terminal ileum, ileocecal valve, appendiceal                            orifice, and rectum were photographed. Scope In: 1:53:31 PM Scope Out: 2:17:20 PM Scope Withdrawal Time: 0 hours 20 minutes 0 seconds  Total Procedure Duration: 0 hours 23 minutes 49 seconds  Findings:                 The perianal exam findings include perianal fistula                            and skin tags.                           Moderate inflammation characterized by altered  vascularity, congestion (edema), erythema, aphthous                            ulcerations and deep ulcerations was found in the                            sigmoid colon. Biopsies were taken with a cold                            forceps for histology. Estimated blood loss was                            minimal.                           Mild inflammation characterized by altered                            vascularity, congestion (edema), and erythema was                            found in the rectum and in the cecum. Biopsies were                            taken with a cold forceps for histology. Estimated                            blood loss was minimal.                           Multiple small and large-mouthed diverticula were                            found in the sigmoid colon.                           Inflammation, mild in severity and  characterized by                            scattered erythema and aphthous ulcerations was                            found in the terminal ileum. Biopsies were taken                            with a cold forceps for histology. Estimated blood                            loss was minimal. Complications:            No immediate complications. Estimated Blood Loss:     Estimated blood loss was minimal. 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                           - Moderate inflammation was found in the sigmoid                            colon secondary to Crohn's disease, with ileitis                            and colitis. Biopsied.                           - Mild inflammation was found in the rectum and in                            the cecum. Biopsied.                           - Diverticulosis in the sigmoid colon.                           - Crohn's disease, with ileitis and colitis.                            Biopsied.                           - The endoscopic appearance is most consistent with                            moderately severe, penetrating, inflammatory,                            ileo-colonic Crohns Disease (path pending) and will                            plan on initiating therapy as below. Recommendation:           - Patient has a contact number available for                            emergencies. The signs and symptoms of potential                            delayed complications were discussed with the                            patient. Return to normal activities tomorrow.                            Written discharge instructions were provided to the                            patient.                           - Resume  previous diet.                           - Continue present medications.                           - Await pathology results.                           -  Repeat colonoscopy in 6 months to evaluate the                            response to therapy.                           - Start prednisone 40 mg PO once a day for 2 weeks,                            then taper to 30 mg/day x1 week, 20 mg/day x1 week,                            15 mg/day x1 week, 10 mg/day x1 week, 5 mg/day x1                            week, then stop.                           - Return to GI clinic in 2-4 weeks.                           - Refer to a colo-rectal surgeon at appointment to                            be scheduled.                           - Perform magnetic resonance imaging (MRI) Pelvis                            with gadolinium at appointment to be scheduled.                           - Check Quantiferon gold, Hepatitis Bs Ab, Hep Bs                            Ag, TPMT today in anticipation of starting biologic                            therapy.                           - Check ESR, CRP.                           - Will follow-up on the pending stool studies. [employer]  Heavin Sebree, MD 02/08/2021 2:44:09 PM

## 2021-02-10 LAB — GI PROFILE, STOOL, PCR

## 2021-02-11 ENCOUNTER — Telehealth: Payer: Self-pay

## 2021-02-11 DIAGNOSIS — K50113 Crohn's disease of large intestine with fistula: Secondary | ICD-10-CM

## 2021-02-11 NOTE — Telephone Encounter (Addendum)
Per 02/08/21 procedure report: - Return to GI clinic in 2-4 weeks - Refer to a colo-rectal surgeon - Perform MRI pelvis with gadolinium  - Patient is scheduled for a follow up with Dr. Barron Alvine on Tuesday, 02/26/21 at 3 PM.  - Referral, records and demographic information faxed to [facility] (latest insurance card is from 2019). - MRI pelvis order in epic. Secure staff message sent to radiology schedulers to contact patient to set up appt.   Patient is aware of all information. She is aware that [facility] will contact her directly to set up her appt, I have provided her with their office number in case she has not heard from them within 2 weeks. Pt is aware that radiology scheduling will be contacting her as well. She verbalized understanding and had no concerns at the end of the call.

## 2021-02-12 ENCOUNTER — Telehealth: Payer: Self-pay

## 2021-02-12 ENCOUNTER — Telehealth: Payer: Self-pay | Admitting: *Deleted

## 2021-02-12 LAB — CALPROTECTIN, FECAL: Calprotectin, Fecal: 3460 ug/g — ABNORMAL HIGH (ref 0–120)

## 2021-02-12 NOTE — Telephone Encounter (Signed)
  Follow up Call-  Call back number 02/08/2021  Post procedure Call Back phone  # [phone]  Permission to leave phone message Yes  Some recent data might be hidden     Patient questions:  Do you have a fever, pain , or abdominal swelling? No. Pain Score  0 *  Have you tolerated food without any problems? Yes.    Have you been able to return to your normal activities? Yes.    Do you have any questions about your discharge instructions: Diet   No. Medications  No. Follow up visit  No.  Do you have questions or concerns about your Care? No.  Actions: * If pain score is 4 or above: No action needed, pain <4.

## 2021-02-12 NOTE — Telephone Encounter (Signed)
First attempt, left VM.  

## 2021-02-13 ENCOUNTER — Telehealth: Payer: Self-pay | Admitting: General Surgery

## 2021-02-13 DIAGNOSIS — K50113 Crohn's disease of large intestine with fistula: Secondary | ICD-10-CM

## 2021-02-13 NOTE — Telephone Encounter (Signed)
Contacted the patient and we went through all of the results that are back thus far. The patient will come to [facility] on Friday to have a Hep A lab drawn. The patient will keep her appointment on 02/26/2021. We spoke of Hep B vaccinations and the patient is willing to start them

## 2021-02-13 NOTE — Telephone Encounter (Signed)
-----  Message from [place], DO sent at 02/12/2021  3:37 PM EDT ----- Both inflammatory markers (CRP and ESR) are significantly elevated at 91.4 and 59 respectively, consistent with newly diagnosed moderately severe Crohn's Disease.  Hepatitis Bs Ab and hepatitis Bs Ag both negative.  Will need hepatitis B vaccine series.  Please check hepatitis A antibody as well to evaluate for immunity status.  We will follow-up on QuantiFERON gold and TPMT when resulted prior to making decision on long-term treatment options.  To keep follow-up appointment in GI clinic with me as scheduled.

## 2021-02-17 LAB — QUANTIFERON-TB GOLD PLUS
Mitogen-NIL: 5.08 IU/mL
NIL: 0.04 IU/mL
QuantiFERON-TB Gold Plus: NEGATIVE
TB1-NIL: 0 IU/mL
TB2-NIL: <0 IU/mL

## 2021-02-17 LAB — THIOPURINE METHYLTRANSFERASE (TPMT), RBC: Thiopurine Methyltransferase, RBC: 10 nmol/hr/mL RBC — ABNORMAL LOW

## 2021-02-17 LAB — HEPATITIS B SURFACE ANTIBODY,QUALITATIVE: Hep B S Ab: NONREACTIVE

## 2021-02-17 LAB — HEPATITIS B SURFACE ANTIGEN: Hepatitis B Surface Ag: NONREACTIVE

## 2021-02-22 ENCOUNTER — Other Ambulatory Visit: Payer: Self-pay

## 2021-02-22 ENCOUNTER — Ambulatory Visit (HOSPITAL_COMMUNITY)
Admission: RE | Admit: 2021-02-22 | Discharge: 2021-02-22 | Disposition: A | Discharge status: Home / Self Care | Payer: Self-pay | Source: Ambulatory Visit | Attending: Gastroenterology | Admitting: Gastroenterology

## 2021-02-22 DIAGNOSIS — K50113 Crohn's disease of large intestine with fistula: Secondary | ICD-10-CM | POA: Insufficient documentation

## 2021-02-22 MED ORDER — GADOBUTROL 1 MMOL/ML IV SOLN
10.0000 mL | Freq: Once | INTRAVENOUS | Status: AC | PRN
  Administered 2021-02-22: 10 mL via INTRAVENOUS

## 2021-02-24 LAB — HEPATITIS A ANTIBODY, TOTAL: Hepatitis A AB,Total: NONREACTIVE

## 2021-02-26 ENCOUNTER — Encounter: Payer: Self-pay | Admitting: Gastroenterology

## 2021-02-26 ENCOUNTER — Ambulatory Visit (INDEPENDENT_AMBULATORY_CARE_PROVIDER_SITE_OTHER): Payer: Self-pay | Admitting: Gastroenterology

## 2021-02-26 ENCOUNTER — Other Ambulatory Visit: Payer: Self-pay

## 2021-02-26 VITALS — BP 138/90 | HR 103 | Ht 62.0 in | Wt 232.4 lb

## 2021-02-26 DIAGNOSIS — K2100 Gastro-esophageal reflux disease with esophagitis, without bleeding: Secondary | ICD-10-CM

## 2021-02-26 DIAGNOSIS — K2970 Gastritis, unspecified, without bleeding: Secondary | ICD-10-CM

## 2021-02-26 DIAGNOSIS — R630 Anorexia: Secondary | ICD-10-CM

## 2021-02-26 DIAGNOSIS — K50113 Crohn's disease of large intestine with fistula: Secondary | ICD-10-CM

## 2021-02-26 DIAGNOSIS — L732 Hidradenitis suppurativa: Secondary | ICD-10-CM

## 2021-02-26 DIAGNOSIS — R110 Nausea: Secondary | ICD-10-CM

## 2021-02-26 DIAGNOSIS — R198 Other specified symptoms and signs involving the digestive system and abdomen: Secondary | ICD-10-CM

## 2021-02-26 DIAGNOSIS — K2980 Duodenitis without bleeding: Secondary | ICD-10-CM

## 2021-02-26 MED ORDER — METRONIDAZOLE 250 MG PO TABS: 250.0000 mg | ORAL_TABLET | Freq: Three times a day (TID) | ORAL | 0 refills | Status: AC

## 2021-02-26 MED ORDER — ONDANSETRON 4 MG PO TBDP: 4.0000 mg | ORAL_TABLET | Freq: Four times a day (QID) | ORAL | 3 refills | Status: DC

## 2021-02-26 MED ORDER — METRONIDAZOLE 500 MG PO TABS: 500.0000 mg | ORAL_TABLET | Freq: Two times a day (BID) | ORAL | 0 refills | Status: AC

## 2021-02-26 NOTE — Progress Notes (Signed)
Chief Complaint:    Procedure follow-up, decreased appetite, abdominal fullness  GI History: 55 year old female with a history of HTN, anxiety, diabetes, HLD, arthritis, GERD, obesity (BMI 44), hidradenitis suppurativa s/p axillary surgery 2007, cholecystectomy 2002, initially seen in GI clinic on 02/05/2021 for evaluation of IDA, FOBT positive stool, fatigue, abdominal pain, change in bowel habits.  Developed GI symptoms in 12/2020 described as upper abdominal pain, increased gas/bloating, mucus-like stools, decreased appetite, postprandial abdominal cramping.  Started develop blood in her stools with increased urgency but no change in frequency.  - 12/28/2020: H/H 11.2/34.8, MCV/RDW 72/18.  Iron 23.  Normal B12, vitamin D, vitamin B1.  Normal CMP.  Started on oral iron - 01/11/2021: FOBT positive - 01/21/2021: H/H 12.3/38.9, MCV/RDW 73.6/20.6.  Iron 51 - 02/05/2021: Evaluated in the GI clinic.  Negative/normal GI PCR.  Fecal calprotectin 3460, ESR 59, CRP 91 - 02/08/2021: EGD: LA Grade A esophagitis, moderate antral gastritis (path: Gastritis), Moderate inflammation in duodenal bulb (path: Peptic duodenitis).  Started on Protonix 40 mg bid - 02/08/2021: Colonoscopy: Moderate inflammation in the sigmoid (altered vascularity, congestion, erythema, aphthous ulcerations and deep ulcerations; path: Severe active colitis), mild inflammation of the rectum and cecum (altered vascularity, congestion, and erythema; path: Focal active proctitis), sigmoid diverticulosis, a few mild at this ulcers in TI (path: Normal), Perianal fistula and skin tags.  Started on prednisone 40 mg/day x2 weeks with taper.  Referred for MRI and additional labs - 02/08/2021: TPMT 10, QuantiFERON gold negative, HBsAb-, HBsAg- - 02/25/2021: MRI pelvis: 1.5 cm intersphincteric perianal abscess along anterior wall of upper anus, sigmoid diverticulosis with mild colonic wall thickening/soft tissue stranding.  Endometrial polyps or  intracavitary fibroids  Separately, history of GERD controlled dietary modifications and occasional Tums.  Rarely uses Protonix.  Sister with Colon Cancer, diagnosed ~age 5. Niece with UC. Father died with Lymphoma; son with hx of Lymphoma.   HPI:     Patient is a 55 y.o. female presenting to the Gastroenterology Clinic for follow-up.   Still feeling tired, generalized abdominal pain, and reduced appetite. Develeoped nausea 3 days ago, which is new.  No emesis.  Since starting steroids, has noticed decreased blood in stools, decreased frequency, and improvement in mucus-like stools.   For her Hidradenitis Suppurativa, still taking Doxycycline 100 mg BID since September and has f/u with Dermatology (Dr. Sammuel Hines) in December to discuss possible biologic therapy.     Review of systems:     No chest pain, no SOB, no fevers, no urinary sx   Past Medical History:  Diagnosis Date   Allergy    Anxiety    Arthritis    mild intermittent joint pain. Rt knee and left hip worst.   Diabetes mellitus without complication ([place])    "pre diabetic level" on Metformin   Environmental allergies    Frequent headaches    GERD (gastroesophageal reflux disease)    she states has heart burn alot.   Hidradenitis    History of chicken pox    Hyperlipidemia    Hypertension    Seasonal allergies    UTI (lower urinary tract infection)     Patient's surgical history, family medical history, social history, medications and allergies were all reviewed in Epic    Current Outpatient Medications  Medication Sig Dispense Refill   chlorthalidone (HYGROTON) 25 MG tablet Take 1 tablet (25 mg total) by mouth daily. 30 tablet 11   doxycycline (VIBRA-TABS) 100 MG tablet Take 1 tablet (100 mg total) by mouth  2 (two) times daily. 14 tablet 0   Iron, Ferrous Sulfate, 325 (65 Fe) MG TABS 1 tab po bid 60 tablet 1   Multiple Vitamin (MULTIVITAMIN) tablet Take 1 tablet by mouth daily. Reported on 07/03/2015      pantoprazole (PROTONIX) 40 MG tablet Take 1 tablet (40 mg total) by mouth 2 (two) times daily. Take 40 mg twice a day for 8 weeks, then reduce to 40 mg daily 90 tablet 3   predniSONE (DELTASONE) 10 MG tablet Take 1 tablet (10 mg total) by mouth daily with breakfast. Take Prednisone 50m once a day for two weeks, then taper to 321mday for 1 week, then 20 mg/day for one week, then 1554may for one week, 10 mg/day for one week, then 5mg20my for one week, then stop. 100 tablet 0   atorvastatin (LIPITOR) 10 MG tablet Take 1 tablet (10 mg total) by mouth daily. (Patient not taking: No sig reported) 30 tablet 3   clonazePAM (KLONOPIN) 0.5 MG tablet Take 1 tablet by mouth twice daily as needed for anxiety (Patient not taking: Reported on 02/26/2021) 10 tablet 0   metFORMIN (GLUCOPHAGE) 500 MG tablet Take 1 tablet (500 mg total) by mouth 2 (two) times daily with a meal. (Patient not taking: No sig reported) 60 tablet 3   venlafaxine XR (EFFEXOR XR) 150 MG 24 hr capsule Take 1 capsule (150 mg total) by mouth daily with breakfast. (Patient not taking: No sig reported) 30 capsule 5   No current facility-administered medications for this visit.    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  Interestingly, path did not demonstrate chronic inflammatory changes.  However, endoscopic appearance, clinical presentation, lab abnormalities, and MRI findings all consistent with Crohn's  Disease.  We discussed that conflicting data today.  Additionally, she is on chronic doxycycline for Hidradenitis Suppurativa, which somewhat complicates using antimicrobials for the perianal abscess vs fistula noted on MRI.  - Referral to Colorectal surgery - Flagyl 500 mg twice daily for 4 weeks initially; if clinical response (ie, cessation of drainage and closure of fistula), continue at 250 mg 3 times daily for an additional 4 weeks - Continue doxycycline as prescribed by Dermatology - Will start paperwork for initiation of infliximab (biosimilar Inflectra) - Check HCV Ab and HAV Ab - Start HBV vaccine series pending above labs - Continue Prednisone slow taper - Will plan to discuss her case at GI multidisciplinary conference - Will send message to her Dermatologist for coordination of care  6) Nausea 7) Gastritis 8) Duodenitis 9) GERD with erosive esophagitis - Zofran prn nausea - Continue high-dose PPI as currently prescribed   RTC in 4 weeks or sooner prn     I spent 45 minutes of time, including in depth chart review, independent review of results as outlined above, communicating results with the patient directly, face-to-face time with the patient, coordinating care, ordering studies and medications as appropriate, and documentation.  Lavena Bullion ,DO, FACG 02/26/2021, 3:17 PM

## 2021-02-26 NOTE — Patient Instructions (Addendum)
If you are age 54 or older, your body mass index should be between 23-30. Your Body mass index is 42.5 kg/m. If this is out of the aforementioned range listed, please consider follow up with your Primary Care Provider.  If you are age 31 or younger, your body mass index should be between 19-25. Your Body mass index is 42.5 kg/m. If this is out of the aformentioned range listed, please consider follow up with your Primary Care Provider.   __________________________________________________________  The [facility] providers would like to encourage you to use [facility] to communicate with providers for non-urgent requests or questions.  Due to long hold times on the telephone, sending your provider a message by [facility] may be a faster and more efficient way to get a response.  Please allow 48 business hours for a response.  Please remember that this is for non-urgent requests.   Please go to the lab on the 2nd floor [address]   We have sent the following medications to your pharmacy for you to pick up at your convenience:  Flagyl and Zofran  [facility] has your referral and they are working on your appointment.  Dr Barron Alvine would like to start you on Remicade. We will send all the information to his RN, Sharol Harness and she will send to your insurance for possible prior authorization.  Thank you for choosing me and [facility].  Vito Cirigliano, D.O.

## 2021-02-27 ENCOUNTER — Telehealth: Payer: Self-pay | Admitting: General Surgery

## 2021-02-27 NOTE — Telephone Encounter (Signed)
Faxed patients office notes for 02/26/2021 to Dr Naoma Diener per Dr Fayne Norrie request.

## 2021-02-28 ENCOUNTER — Telehealth: Payer: Self-pay

## 2021-02-28 NOTE — Telephone Encounter (Signed)
Received a call from Joneen Caraway, PA-C with [facility]. She states that she received your latest office note and reviewed. She states that their office prescribes Humira often to treat hidradenitis suppurativa and it will also address patient's Crohn's disease. She saw that you recommended starting patient on Remicade which would only be effective for patient's Crohn's disease. Theresia Majors was interested to know why Remicade was chosen over Humira. She states that it will ultimately be your decision.  If you would like to discuss further with Theresia Majors you may reach her on her cell at [phone]. Her nurse is Arline Asp [phone], ext. [phone]).

## 2021-02-28 NOTE — Telephone Encounter (Signed)
I spoke with Kristin Wong at [facility] today.  Apparently Humira has better data with regards to treating Hidradenitis Suppurativa than Remicade.  As we would much prefer 1 agent to treat to disease courses, can we please call the patient to inform her that we would like to make a change from prior authorization of Remicade to Humira instead.  As this is still a TNF biologic agent, the side effects, risks, benefits that we discussed in clinic are all still the same.  The only difference is this is an injectable rather than an infusion drug.  This is still a very appropriate medication for treating complex Crohn's Disease.  Thank you.

## 2021-03-01 NOTE — Telephone Encounter (Signed)
Patient notified of recommendations via my chart. No updated insurance on file, latest insurance card is from 2019. Checking with patient to see if she has updated insurance information.

## 2021-03-04 NOTE — Telephone Encounter (Addendum)
Patient reports that she does not currently have insurance. Advised that we will work on getting her assistance for Humira through my [employer] assist. See patient message for details

## 2021-03-04 NOTE — Telephone Encounter (Signed)
Humira application for [employer] assist has been printed. Provider portion of application has been completed. Pt has been advised to come by the office to pick up her portion of the application and complete. Pt advised to return completed application along with financial records to our office so that the entire application can be faxed in.

## 2021-03-14 NOTE — Telephone Encounter (Signed)
Left detailed vm for patient. Advised that I am just following up to see how her portion of the application process is coming along. Advised that I have not faxed the provider portion yet, I was waiting on her portion so the entire application can be faxed. Advised pt to call or send a my chart message with an update or any questions if she has any.

## 2021-03-26 ENCOUNTER — Telehealth: Payer: Self-pay | Admitting: Physician Assistant

## 2021-03-26 DIAGNOSIS — Z91199 Patient's noncompliance with other medical treatment and regimen due to unspecified reason: Secondary | ICD-10-CM

## 2021-03-26 NOTE — Progress Notes (Signed)
The patient no-showed for appointment despite this provider sending direct link, reaching out via phone with no response and waiting for at least 10 minutes from appointment time for patient to join. They will be marked as a NS for this appointment/time.  ? ?Kristin Wong Cody Bryant Lipps, PA-C ? ? ? ?

## 2021-03-28 NOTE — Telephone Encounter (Signed)
Left detailed vm for patient to return call to discuss update on Humira application. Advised patient that she can give [place] a call back or send a my chart message with an update. I sent patient a my chart message as well to follow up.

## 2021-04-08 NOTE — Telephone Encounter (Signed)
Patient responded via my chart. She has faxed her portion of the [employer] assist forms. Physician portion of [employer] assist application along with chart notes has been faxed back to [employer] assist.

## 2021-04-29 ENCOUNTER — Encounter: Payer: Self-pay | Admitting: Gastroenterology

## 2021-04-29 DIAGNOSIS — K209 Esophagitis, unspecified without bleeding: Secondary | ICD-10-CM

## 2021-04-29 DIAGNOSIS — K2970 Gastritis, unspecified, without bleeding: Secondary | ICD-10-CM

## 2021-04-29 DIAGNOSIS — K269 Duodenal ulcer, unspecified as acute or chronic, without hemorrhage or perforation: Secondary | ICD-10-CM

## 2021-04-29 DIAGNOSIS — K219 Gastro-esophageal reflux disease without esophagitis: Secondary | ICD-10-CM

## 2021-04-29 DIAGNOSIS — K2980 Duodenitis without bleeding: Secondary | ICD-10-CM

## 2021-04-29 DIAGNOSIS — K259 Gastric ulcer, unspecified as acute or chronic, without hemorrhage or perforation: Secondary | ICD-10-CM

## 2021-04-30 NOTE — Telephone Encounter (Signed)
I called and spoke with [name] at [facility] assist today to follow up on patient's Humira application. They need pt to re-upload her proof of income in the portal. I have sent recommendations to pt via my chart.

## 2021-04-30 NOTE — Telephone Encounter (Signed)
I called and spoke with [name] at [facility] assist today to follow up on patient's Humira application. They need pt to re-upload her proof of income in the portal. I have sent recommendations to pt via my chart. Will send in prescriptions once pt confirms pharmacy.

## 2021-04-30 NOTE — Telephone Encounter (Signed)
Can you please check on the status of the Humira application.  Given return of symptoms previously requiring steroids and prolonged course of Flagyl, certainly requires therapy to reinduce remission while awaiting Humira.  Plan for the following:  - Prednisone 40 mg/day x2 weeks then decrease by 10 mg/day every 7 days - Restart metronidazole 250 mg tid x4 weeks - If Humira gets approved quickly, can expedite steroid wean and come off ABX

## 2021-05-01 ENCOUNTER — Encounter: Payer: Self-pay | Admitting: Family Medicine

## 2021-05-01 MED ORDER — PREDNISONE 10 MG PO TABS: ORAL_TABLET | ORAL | 0 refills | Status: AC

## 2021-05-01 MED ORDER — METRONIDAZOLE 250 MG PO TABS: 250.0000 mg | ORAL_TABLET | Freq: Three times a day (TID) | ORAL | 0 refills | Status: AC

## 2021-05-09 NOTE — Progress Notes (Signed)
GYNECOLOGY OFFICE VISIT NOTE  History:   Kristin Wong is a 56 y.o. [phone] here today for follow up from her MRI.   She is still perimenopausal based on her bleeding pattern. She has regular menses for 6-8 months and then will stop for several months. She has not gone one year without a period. She denies any AUB with the periods. She has no family history to be guided by as her sister had breast cancer at 73 and the chemo put her into menopause and her mother had a hysterectomy.   She is sexually active without issue.   She also has not had an annual in many years or a mammogram.   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 Incidentally she was noted to have either fibroids or polyps on the MRI and correlation was recommended with ultrasound. They measured about 1.2-1.5 cm and there were two findings.   She denies any abnormal vaginal discharge, bleeding, pelvic pain or other concerns.    Past Medical History:  Diagnosis Date   Allergy    Anxiety    Arthritis    mild intermittent joint pain. Rt knee and left hip worst.   Diabetes mellitus without complication ([facility])    "pre diabetic level" on Metformin   Environmental allergies    Frequent headaches    GERD (gastroesophageal reflux disease)    she states has heart burn alot.   Hidradenitis    History of chicken pox    Hyperlipidemia    Hypertension    Seasonal allergies    UTI (lower urinary tract infection)     Past Surgical History:  Procedure Laterality Date   AXILLARY SURGERY     Sweat glands removed, bilateral 2007   CHOLECYSTECTOMY     2002   COLONOSCOPY  02/08/2021   Vito Cirigliano at [facility]   TUBAL LIGATION     1996   WISDOM TOOTH EXTRACTION      The following portions of the patient's history were reviewed and updated as appropriate: allergies, current medications, past family history, past medical history,  past social history, past surgical history and problem list.   Health Maintenance:   Diagnosis  Date Value Ref Range Status  02/03/2018   Final   NEGATIVE FOR INTRAEPITHELIAL LESIONS OR MALIGNANCY.     Review of Systems:  Pertinent items noted in HPI and remainder of comprehensive ROS otherwise negative.  Physical Exam:  There were no vitals taken for this visit. CONSTITUTIONAL: Well-developed, well-nourished female in no acute distress.  HEENT:  Normocephalic, atraumatic. External right and left ear normal. No scleral icterus.  NECK: Normal range of motion, supple, no masses noted on observation SKIN: No rash noted. Not diaphoretic. No erythema. No pallor. MUSCULOSKELETAL: Normal range of motion. No edema noted. NEUROLOGIC: Alert and oriented to person, place, and time. Normal muscle tone coordination. No cranial nerve deficit noted. PSYCHIATRIC: Normal mood and affect. Normal behavior. Normal judgment and thought content.  Breasts: breasts appear normal, no suspicious masses, no skin or nipple changes or axillary nodes.  CARDIOVASCULAR: Normal heart rate noted RESPIRATORY: Effort and breath sounds normal, no problems with respiration noted ABDOMEN: No masses noted. No other overt distention noted.    PELVIC: Normal appearing external genitalia; normal urethral meatus; normal appearing vaginal mucosa and cervix.  No abnormal discharge noted.  Normal uterine size, no other palpable masses, no uterine or adnexal tenderness. Performed in  the presence of a chaperone  Labs and Imaging No results found for this or any previous visit (from the past 168 hour(s)). No results found.   MRI report reviewed from 10/31 as noted in HPI.   Assessment and Plan:  Diagnoses and all orders for this visit:  Lesion of endometrium - For best characterization I recommended follow up with TVUS as suggested to tell if polyp vs fibroid - We discussed if fibroid, then no additional work up would be  needed based on the size.  - If polyps, we discussed that the majority of these are benign growths although some may have hyperplasia and even less commonly some may be cancerous. In the event of polyps, while expectant management may be elected I ultimately would recommend hysteroscopy, D&C to remove them as they can cause postmenopausal bleeding and AUB in the premenopausal patient and due to a chance of endometrial cancer. The timing of these can be worked around her schedule and workup/treatment for her current GI symptoms.  -     US PELVIC COMPLETE WITH TRANSVAGINAL; Future  Cervical cancer screening -     Cytology - PAP( [facility])  Encounter for screening mammogram for malignant neoplasm of breast -     MM DIGITAL SCREENING BILATERAL; Future   Routine preventative health maintenance measures emphasized. Please refer to After Visit Summary for other counseling recommendations.   No follow-ups on file.  Milas Hock, MD, FACOG Obstetrician & Gynecologist, [facility] for [facility], [facility]

## 2021-05-10 ENCOUNTER — Other Ambulatory Visit: Payer: Self-pay

## 2021-05-10 ENCOUNTER — Encounter: Payer: Self-pay | Admitting: Obstetrics and Gynecology

## 2021-05-10 ENCOUNTER — Ambulatory Visit (INDEPENDENT_AMBULATORY_CARE_PROVIDER_SITE_OTHER): Payer: 59 | Admitting: Obstetrics and Gynecology

## 2021-05-10 ENCOUNTER — Other Ambulatory Visit (HOSPITAL_COMMUNITY)
Admission: RE | Admit: 2021-05-10 | Discharge: 2021-05-10 | Disposition: A | Discharge status: Home / Self Care | Payer: 59 | Source: Ambulatory Visit | Attending: Obstetrics and Gynecology | Admitting: Obstetrics and Gynecology

## 2021-05-10 VITALS — BP 140/91 | HR 71 | Wt 232.0 lb

## 2021-05-10 DIAGNOSIS — Z124 Encounter for screening for malignant neoplasm of cervix: Secondary | ICD-10-CM

## 2021-05-10 DIAGNOSIS — N859 Noninflammatory disorder of uterus, unspecified: Secondary | ICD-10-CM

## 2021-05-10 DIAGNOSIS — Z1231 Encounter for screening mammogram for malignant neoplasm of breast: Secondary | ICD-10-CM | POA: Diagnosis not present

## 2021-05-13 ENCOUNTER — Ambulatory Visit (HOSPITAL_BASED_OUTPATIENT_CLINIC_OR_DEPARTMENT_OTHER): Payer: 59

## 2021-05-13 LAB — CYTOLOGY - PAP
Comment: NEGATIVE
Diagnosis: NEGATIVE
High risk HPV: NEGATIVE

## 2021-05-20 ENCOUNTER — Ambulatory Visit (HOSPITAL_BASED_OUTPATIENT_CLINIC_OR_DEPARTMENT_OTHER): Admission: RE | Admit: 2021-05-20 | Payer: 59 | Source: Ambulatory Visit

## 2021-05-20 ENCOUNTER — Inpatient Hospital Stay (HOSPITAL_BASED_OUTPATIENT_CLINIC_OR_DEPARTMENT_OTHER): Admission: RE | Admit: 2021-05-20 | Payer: 59 | Source: Ambulatory Visit

## 2021-05-21 ENCOUNTER — Telehealth (HOSPITAL_BASED_OUTPATIENT_CLINIC_OR_DEPARTMENT_OTHER): Payer: Self-pay

## 2021-05-21 ENCOUNTER — Ambulatory Visit: Payer: 59 | Admitting: Medical

## 2021-05-22 NOTE — Telephone Encounter (Signed)
My chart message sent to patient to follow up and see if she has heard anything from [employer] assist.

## 2021-05-22 NOTE — Telephone Encounter (Signed)
Patient's response to my chart message:"Unfortunately they wanted a tax return and I had to order a transcript that has not arrived yet. It should be here hopefully this week. "   See 04/29/21 patient message for details.

## 2021-06-03 ENCOUNTER — Other Ambulatory Visit: Payer: Self-pay | Admitting: Medical

## 2021-06-03 DIAGNOSIS — S46811A Strain of other muscles, fascia and tendons at shoulder and upper arm level, right arm, initial encounter: Secondary | ICD-10-CM

## 2021-06-10 ENCOUNTER — Other Ambulatory Visit: Payer: Self-pay

## 2021-06-10 ENCOUNTER — Telehealth: Payer: Self-pay | Admitting: Gastroenterology

## 2021-06-10 DIAGNOSIS — K50113 Crohn's disease of large intestine with fistula: Secondary | ICD-10-CM

## 2021-06-10 DIAGNOSIS — K921 Melena: Secondary | ICD-10-CM

## 2021-06-10 NOTE — Telephone Encounter (Signed)
Sent a my chart message as follows:  "I'm running a fever and am having nausea and severe stomach cramps along with diarrhea."

## 2021-06-10 NOTE — Telephone Encounter (Signed)
Pt stated she has not been able to start humira yet but that [employer] assist stated they have everything they need now so she should be starting it soon. Gave pt recommendations. Labs entered so pt can get labs [place]. Pt scheduled for office visit with Willette Cluster, NP on 06/13/21 at 11 am. Pt verbalized understanding and had no other concerns at end of call.

## 2021-06-10 NOTE — Telephone Encounter (Signed)
Left message for pt to call back  °

## 2021-06-10 NOTE — Telephone Encounter (Signed)
Pt reports that episode started 4-5 days ago. Pt states she has a temp of about 99 for most of the day and then temp will go up to 101-102 in the evening. Pt also reports that she has at least 12 episodes of diarrhea a day and has blood in stool. Told pt she may need to go to ER but pt states she doesn't feel like she's at that point yet because she has been drinking fluids to prevent dehydration. Please advise.

## 2021-06-10 NOTE — Addendum Note (Signed)
Addended by: Garen Lah A on: 06/10/2021 01:44 PM   Modules accepted: Orders

## 2021-06-10 NOTE — Telephone Encounter (Signed)
Attempted to call pt. Left message for pt to call back.  

## 2021-06-10 NOTE — Telephone Encounter (Signed)
Has she been able to start the Humira yet?  Based on reported symptoms, very suspicious for active flare, with plan for the following:  - Check GI PCR panel, C. difficile, fecal calprotectin - Check ESR, CRP, CBC, BMP - Agree that if worsening symptoms, worsening fever, inability tolerate p.o. intake, lightheadedness, or other concerns, ER for expedited evaluation - Schedule follow-up appointment with me or one of the APP's

## 2021-06-12 ENCOUNTER — Other Ambulatory Visit: Payer: Self-pay

## 2021-06-12 ENCOUNTER — Encounter: Payer: Self-pay | Admitting: Gastroenterology

## 2021-06-12 NOTE — Telephone Encounter (Signed)
Chart reviewed. No elevated WBC. CT with left sided colitis. Was given Levofloxacin for presumed infectious colitis, but I would be more inclined to think this is continue IBD flare. Plan for the following: - Keep appt in GI clinic tomorrow - No need for repeat CBC or CMP, but will still check ESR, CRP, and stool studies tomorrow  - May need expedited colonoscopy or flex sig to eval. Will be able to discuss at her appt tomorrow.

## 2021-06-13 ENCOUNTER — Ambulatory Visit (INDEPENDENT_AMBULATORY_CARE_PROVIDER_SITE_OTHER): Payer: 59 | Admitting: Nurse Practitioner

## 2021-06-13 ENCOUNTER — Other Ambulatory Visit (INDEPENDENT_AMBULATORY_CARE_PROVIDER_SITE_OTHER): Payer: 59

## 2021-06-13 ENCOUNTER — Encounter: Payer: Self-pay | Admitting: Nurse Practitioner

## 2021-06-13 VITALS — BP 136/70 | HR 82 | Ht 62.0 in | Wt 222.4 lb

## 2021-06-13 DIAGNOSIS — K50919 Crohn's disease, unspecified, with unspecified complications: Secondary | ICD-10-CM

## 2021-06-13 LAB — C-REACTIVE PROTEIN: CRP: 16.9 mg/dL (ref 0.5–20.0)

## 2021-06-13 LAB — SEDIMENTATION RATE: Sed Rate: 95 mm/hr — ABNORMAL HIGH (ref 0–30)

## 2021-06-13 NOTE — Progress Notes (Signed)
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 Interestingly pathology report negative for chronic inflammation but endoscopic findings, labs, MRI are all c/w with IBD. She saw Surgery and no need for surgery according to patient. Working with [place] to get her Humira.  Recent 2nd course of steroids didn't help as much as first round given at time of diagnosis in October. She is having loose stools with blood, abdominal pain, poor appetite. CTAP 06/11/21 >>Wall thickening and surrounding stranding involving the proximal to mid sigmoid colon.  --ED gave her Levoquin and Bentyl. She feels a little better but still miserable However she doesn't really want another round of steroids while awaiting Humira approval.  --Will obtain GI path panel, CRP, ESR. No need to repeat other labs as they were just done on 2/14.  --Await above studies, discuss with Dr. Bryan Lemma.    HISTORY OF PRESENT ILLNESS    Chief Complaint : Crohn's disease flare  Kristin Wong is a 56 y.o. female with a past medical history of HTN, DM, anxiety, arthritis, obesity, GERD, Ileocolonic Crohn's disease, hidradenitis s/p axillary surgery, [place] of colon cancer in sister ~ age 61, cholecystectomy.  Additional medical history as listed in [place] .   Patient is known to Dr.  Bryan Lemma. She established care here in October 2022 for evaluation of multiple GI symptoms. Subsequently had EGD and colonoscopy with findings of mild esophagitis, gastritis, peptic duodenitis.  Colonoscopy remarkable for  inflammation involving the rectum, sigmoid and cecum .  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  Biopsies taken.  She was started on prednisone , scheduled for MRI which showed 1.5 cm intersphincteric perianal abscess along anterior wall of upper anus, sigmoid diverticulosis with mild colonic wall thickening/soft tissue stranding. Endometrial  polyps or intracavitary fibroids. Symptoms improved with steroids. Interestingly, path did not demonstrate chronic inflammatory changes.  However, endoscopic appearance, clinical presentation, MRI, labs all c/w Crohn's. She was referred to Colorectal Surgery. Plan was to start Humira.    06/11/21 Called office with fever, severe diarrhea with blood. We recommended stool studies, labs and ED if needed. She ended up going to [facility] ED. Labs: K+ 3.2. WBC 92, hgb 13.7.  CT scan >> Wall thickening and surrounding stranding involving the proximal to  mid sigmoid colon most compatible with infectious or inflammatory  colitis.  Given Levaquin , K+, and dicyclomine r.   INTERVAL HISTORY: Call She feels a little better but still poor overall. Saw she did see Surgery who didn't feel fistula required surgery at this point but she feels miserable.She has periumbilical pain. She gets urge to have a BM with anything she eats or drinks.  She has no appetite. She has rectal discomfort with BMs. Still passing loose bloody stools. Seems like the last course of prednisone wasn't as helpful as the first.  Kristin Wong has been missing a lot of work. She works in a [employer]. She is asking about whether to return to if to expect side of effects of Humira when she starts it. The pharmaceutical company is helping her get the drug and in process of verifying income.      LABORATORY DATA  Hepatic Function Latest Ref Rng & Units 12/28/2020 05/17/2019 12/08/2017  Total Protein 6.0 - 8.3 g/dL 7.3 7.3 7.1  Albumin 3.5 - 5.2 g/dL 4.0 4.3 4.0  AST 0 - 37 U/L 13 18 16  ALT 0 - 35 U/L 16 22 19   Alk Phosphatase 39 - 117 U/L 63 61 62  Total Bilirubin 0.2 - 1.2 mg/dL 0.4 0.4 0.5    CBC Latest Ref Rng & Units 01/21/2021 12/28/2020 12/08/2017  WBC 4.0 - 10.5 K/uL 8.1 7.8 6.6  Hemoglobin 12.0 - 15.0 g/dL 12.3 11.2(L) 13.3  Hematocrit 36.0 - 46.0 % 38.9 34.8(L) 39.3  Platelets 150.0 - 400.0 K/uL 369.0 303.0 295.0    No results found for:  LIPASE   PREVIOUS IMAGING   CTAP w/ contrast - [facility] Wall thickening and surrounding stranding involving the proximal to  mid sigmoid colon most compatible with infectious or inflammatory  colitis.    PREVIOUS ENDOSCOPIC EVALUATIONS    02/08/2021: EGD: LA Grade A esophagitis, moderate antral gastritis (path: Gastritis), Moderate inflammation in duodenal bulb (path: Peptic duodenitis).  Started on Protonix 40 mg bid - 02/08/2021: Colonoscopy: Moderate inflammation in the sigmoid (altered vascularity, congestion, erythema, aphthous ulcerations and deep ulcerations; path: Severe active colitis), mild inflammation of the rectum and cecum (altered vascularity, congestion, and erythema; path: Focal active proctitis), sigmoid diverticulosis, a few mild at this ulcers in TI (path: Normal), Perianal fistula and skin tags.  Started on prednisone 40 mg/day x2 weeks with taper.  Referred for MRI and additional labs   Past Medical History:  Diagnosis Date   Allergy    Anxiety    Arthritis    mild intermittent joint pain. Rt knee and left hip worst.   Diabetes mellitus without complication ([place])    "pre diabetic level" on Metformin   Environmental allergies    Frequent headaches    GERD (gastroesophageal reflux disease)    she states has heart burn alot.   Hidradenitis    History of chicken pox    Hyperlipidemia    Hypertension    Seasonal allergies    UTI (lower urinary tract infection)     Past Surgical History:  Procedure Laterality Date   AXILLARY SURGERY     Sweat glands removed, bilateral 2007   CHOLECYSTECTOMY     2002   COLONOSCOPY  02/08/2021   Gerrit Heck at [place] EXTRACTION      Current Medications, Allergies, Family History and Social History were reviewed in [employer] record.     Current Outpatient Medications  Medication Sig Dispense Refill   chlorthalidone (HYGROTON) 25 MG tablet Take 1  tablet (25 mg total) by mouth daily. 30 tablet 11   dicyclomine (BENTYL) 20 MG tablet Take 20 mg by mouth 2 (two) times daily.     Iron, Ferrous Sulfate, 325 (65 Fe) MG TABS 1 tab po bid 60 tablet 1   levofloxacin (LEVAQUIN) 750 MG tablet Take 750 mg by mouth. 1 tablet for 10 days     Multiple Vitamin (MULTIVITAMIN) tablet Take 1 tablet by mouth daily. Reported on 07/03/2015     ondansetron (ZOFRAN ODT) 4 MG disintegrating tablet Take 1 tablet (4 mg total) by mouth every 6 (six) hours. 30 tablet 3   pantoprazole (PROTONIX) 40 MG tablet Take 1 tablet (40 mg total) by mouth 2 (two) times daily. Take 40 mg twice a day for 8 weeks, then reduce to 40 mg daily 90 tablet 3   No current facility-administered medications for this visit.    Review of Systems: No chest pain. No shortness of breath. No urinary complaints.   PHYSICAL EXAM :  Wt Readings from Last 3 Encounters:  06/13/21 222 lb 6.4 oz (100.9 kg)  05/10/21 232 lb (105.2 kg)  02/26/21 232 lb 6 oz (105.4 kg)    BP 136/70    Pulse 82    Ht 5' 2"  (1.575 m)    Wt 222 lb 6.4 oz (100.9 kg)    SpO2 97%    BMI 40.68 kg/m  Constitutional:  Generally well appearing female in no acute distress. Psychiatric: Pleasant. Normal mood and affect. Behavior is normal. EENT: Pupils normal.  Conjunctivae are normal. No scleral icterus. Neck supple.  Cardiovascular: Normal rate, regular rhythm. No edema Pulmonary/chest: Effort normal and breath sounds normal. No wheezing, rales or rhonchi. Abdominal: Soft, nondistended, nontender. Bowel sounds active throughout. There are no masses palpable. No hepatomegaly. Neurological: Alert and oriented to person place and time. Skin: Skin is warm and dry. No rashes noted.  Tye Savoy, NP  06/13/2021, 11:26 AM

## 2021-06-13 NOTE — Patient Instructions (Signed)
LABS:   Please proceed to the basement level for lab work before leaving today. Press "B" on the elevator. The lab is located at the first door on the left as you exit the elevator.  HEALTHCARE LAWS AND MY CHART RESULTS:   Due to recent changes in healthcare laws, you may see results of your imaging and/or laboratory studies on MyChart before I have had a chance to review them.  I understand that in some cases there may be results that are confusing or concerning to you. Please understand that not all results are received at the same time and often I may need to interpret multiple results in order to provide you with the best plan of care or course of treatment. Therefore, I ask that you please give me 48 hours to thoroughly review all your results before contacting my office for clarification.   We have scheduled you a follow up with Dr. Barron Alvine on 07/09/21 at 1:40 pm.  BMI:  If you are age 17 or older, your body mass index should be between 23-30. Your Body mass index is 40.68 kg/m. If this is out of the aforementioned range listed, please consider follow up with your Primary Care Provider.  If you are age 75 or younger, your body mass index should be between 19-25. Your Body mass index is 40.68 kg/m. If this is out of the aformentioned range listed, please consider follow up with your Primary Care Provider.   MY CHART:  The [place] [facility] providers would like to encourage you to use [facility] to communicate with providers for non-urgent requests or questions.  Due to long hold times on the telephone, sending your provider a message by [facility] may be a faster and more efficient way to get a response.  Please allow 48 business hours for a response.  Please remember that this is for non-urgent requests.   Thank you for trusting me with your gastrointestinal care!    Willette Cluster, NP

## 2021-06-14 ENCOUNTER — Other Ambulatory Visit: Payer: 59

## 2021-06-14 DIAGNOSIS — K921 Melena: Secondary | ICD-10-CM

## 2021-06-14 DIAGNOSIS — K50113 Crohn's disease of large intestine with fistula: Secondary | ICD-10-CM

## 2021-06-15 ENCOUNTER — Telehealth (HOSPITAL_BASED_OUTPATIENT_CLINIC_OR_DEPARTMENT_OTHER): Payer: Self-pay

## 2021-06-17 LAB — GI PROFILE, STOOL, PCR

## 2021-06-19 ENCOUNTER — Other Ambulatory Visit: Payer: Self-pay

## 2021-06-19 ENCOUNTER — Telehealth: Payer: Self-pay | Admitting: General Surgery

## 2021-06-19 ENCOUNTER — Encounter: Payer: Self-pay | Admitting: Gastroenterology

## 2021-06-19 DIAGNOSIS — K50113 Crohn's disease of large intestine with fistula: Secondary | ICD-10-CM

## 2021-06-19 DIAGNOSIS — R110 Nausea: Secondary | ICD-10-CM

## 2021-06-19 DIAGNOSIS — K50919 Crohn's disease, unspecified, with unspecified complications: Secondary | ICD-10-CM

## 2021-06-19 DIAGNOSIS — R198 Other specified symptoms and signs involving the digestive system and abdomen: Secondary | ICD-10-CM

## 2021-06-19 NOTE — Progress Notes (Signed)
Agree with the assessment and plan as outlined by Paula Guenther, NP. ° °Derenda Giddings, DO, FACG ° °

## 2021-06-19 NOTE — Telephone Encounter (Signed)
-----   Message from [facility] V, DO sent at 06/18/2021  4:57 PM EST ----- GI PCR panel positive for norovirus.  This is generally self-limiting and no need for antibiotic or antiviral therapy.  Please call to check in on the patient and see how she is feeling.

## 2021-06-19 NOTE — Telephone Encounter (Signed)
Contacted the patient LVM on patient machine stating her results and she should call the office to advise [place] of how she is feeling, or she may also send a mychart message.

## 2021-06-19 NOTE — Telephone Encounter (Addendum)
Spoke with pt and gave pt recommendations. Pt verbalized understanding and stated that she has submitted everything to [facility] and called them to make sure they had everything. She stated that when she logged on today the status still said in progress. Pt stated she would go to [place] for lab. Order for fecal calprotectin placed.

## 2021-06-19 NOTE — Telephone Encounter (Signed)
Agree, hopefully infection runs its course is to over soon.  However, I am still suspicious that this represents flare brought on by infection.  Please check fecal calprotectin for comparison to the previous.  Where are we on getting Humira approved? Continue hydration, PO as tolerated. Can use Bentyl for abdominal pain/cramps.

## 2021-07-09 ENCOUNTER — Encounter: Payer: Self-pay | Admitting: Gastroenterology

## 2021-07-09 ENCOUNTER — Ambulatory Visit (INDEPENDENT_AMBULATORY_CARE_PROVIDER_SITE_OTHER): Payer: 59 | Admitting: Gastroenterology

## 2021-07-09 ENCOUNTER — Other Ambulatory Visit: Payer: Self-pay

## 2021-07-09 VITALS — BP 124/82 | HR 103 | Ht 62.0 in | Wt 215.4 lb

## 2021-07-09 DIAGNOSIS — K50919 Crohn's disease, unspecified, with unspecified complications: Secondary | ICD-10-CM | POA: Diagnosis not present

## 2021-07-09 DIAGNOSIS — L732 Hidradenitis suppurativa: Secondary | ICD-10-CM

## 2021-07-09 DIAGNOSIS — R630 Anorexia: Secondary | ICD-10-CM

## 2021-07-09 DIAGNOSIS — R198 Other specified symptoms and signs involving the digestive system and abdomen: Secondary | ICD-10-CM | POA: Diagnosis not present

## 2021-07-09 NOTE — Progress Notes (Signed)
? ?Chief Complaint:    Crohn Disease ? ?GI History: 56 year old female with a history of HTN, anxiety, diabetes, HLD, arthritis, GERD, obesity (BMI 44), hidradenitis suppurativa s/p axillary surgery 2007, cholecystectomy 2002, initially seen in GI clinic on 02/05/2021 for evaluation of IDA, FOBT positive stool, fatigue, abdominal pain, change in bowel habits. ? ?Developed GI symptoms in 12/2020 described as upper abdominal pain, increased gas/bloating, mucus-like stools, decreased appetite, postprandial abdominal cramping.  Started develop blood in her stools with increased urgency but no change in frequency. ?  ?- 12/28/2020: H/H 11.2/34.8, MCV/RDW 72/18.  Iron 23.  Normal B12, vitamin D, vitamin B1.  Normal CMP.  Started on oral iron ?- 01/11/2021: FOBT positive ?- 01/21/2021: H/H 12.3/38.9, MCV/RDW 73.6/20.6.  Iron 51 ?- 02/05/2021: Evaluated in the GI clinic.  Negative/normal GI PCR.  Fecal calprotectin 3460, ESR 59, CRP 91 ?- 02/08/2021: EGD: LA Grade A esophagitis, moderate antral gastritis (path: Gastritis), Moderate inflammation in duodenal bulb (path: Peptic duodenitis).  Started on Protonix 40 mg bid ?- 02/08/2021: Colonoscopy: Moderate inflammation in the sigmoid (altered vascularity, congestion, erythema, ?aphthous ulcerations and deep ulcerations; path: Severe active colitis), mild inflammation of the rectum and cecum (altered vascularity, congestion, and erythema; path: Focal active proctitis), sigmoid diverticulosis, a few mild at this ulcers in TI (path: Normal), Perianal fistula and skin tags.  Started on prednisone 40 mg/day x2 weeks with taper.  Referred for MRI and additional labs ?- 02/08/2021: TPMT 10, QuantiFERON gold negative, HBsAb-, HBsAg- ?- 02/25/2021: MRI pelvis: 1.5 cm intersphincteric perianal abscess along anterior wall of upper anus, sigmoid diverticulosis with mild colonic wall thickening/soft tissue stranding.  Endometrial polyps or intracavitary fibroids ?  ?Separately, history of  GERD controlled dietary modifications and occasional Tums.  Rarely uses Protonix. ? ?Sister with Colon Cancer, diagnosed ~age 51. Niece with UC. Father died with Lymphoma; son with hx of Lymphoma. ? ?HPI:   ? ? ?Patient is a 56 y.o. female presenting to the Gastroenterology Clinic for follow-up.  Was last seen on 06/13/2021 by Tye Savoy.  At that time, was having active symptoms to include fever, diarrhea with bloody stools. ?- 06/11/2021: CT A/P: Wall thickening and surrounding stranding involving the proximal to mid sigmoid colon.  Treated in ER with Levaquin, dicyclomine ?- 06/13/2021: GI clinic follow-up as above.  Still with loose bloody stools.  Reports last response to prednisone.  GI PCR panel positive for norovirus.  ESR 95, CRP 16.9.  Fecal calprotectin not collected. ? ?Since that appt, has continued to have generalized abd pain, intermittent fever (Tmax 102.3, but more recently 100.0), increased stool frequency. Decreased appetite.  Arthralgias in ankles, knees, hips and milder painin shoulders and elbows.  No dermatologic symptoms.  Treats with APAP and alternating ice/heat. Uses Bentyl for abdominal cramping, but not much response.  ? ?Was recently approved to start Humira.  Has nursing instructions scheduled for tomorrow and planning to start on Friday when this arrives. ? ?Review of systems:     No chest pain, no SOB, no fevers, no urinary sx  ? ?Past Medical History:  ?Diagnosis Date  ? Allergy   ? Anxiety   ? Arthritis   ? mild intermittent joint pain. Rt knee and left hip worst.  ? Diabetes mellitus without complication ([place])   ? "pre diabetic level" on Metformin  ? Environmental allergies   ? Frequent headaches   ? GERD (gastroesophageal reflux disease)   ? she states has heart burn alot.  ? Hidradenitis   ?  History of chicken pox   ? Hyperlipidemia   ? Hypertension   ? Seasonal allergies   ? UTI (lower urinary tract infection)   ? ? ?Patient's surgical history, family medical history, social  history, medications and allergies were all reviewed in Epic  ? ? ?Current Outpatient Medications  ?Medication Sig Dispense Refill  ? chlorthalidone (HYGROTON) 25 MG tablet Take 1 tablet (25 mg total) by mouth daily. 30 tablet 11  ? Iron, Ferrous Sulfate, 325 (65 Fe) MG TABS 1 tab po bid 60 tablet 1  ? Multiple Vitamin (MULTIVITAMIN) tablet Take 1 tablet by mouth daily. Reported on 07/03/2015    ? ondansetron (ZOFRAN ODT) 4 MG disintegrating tablet Take 1 tablet (4 mg total) by mouth every 6 (six) hours. 30 tablet 3  ? pantoprazole (PROTONIX) 40 MG tablet Take 1 tablet (40 mg total) by mouth 2 (two) times daily. Take 40 mg twice a day for 8 weeks, then reduce to 40 mg daily 90 tablet 3  ? ?No current facility-administered medications for this visit.  ? ? ?Physical Exam:   ? ? ?BP 124/82 (BP Location: Left Arm, Cuff Size: Large)   Pulse (!) 103   Ht _0  (1.575 m)   Wt 215 lb 6 oz (97.7 kg)   BMI 39.39 kg/m?  ? ?GENERAL:  Pleasant female in NAD ?PSYCH: : Cooperative, normal affect ?NEURO: Alert and oriented x 3, no focal neurologic deficits ? ? ?IMPRESSION and PLAN:   ? ?1) Crohn's Disease ?2) Hx of Perianal fistula  ?3) Hidradenitis Suppurativa ?4) Decreased appetite ?5) Diarrhea/Increased to frequency ? ?56 year old female with ileocolonic, penetrating Crohn's Disease with initial response to steroids, but suboptimal response to a second course of steroids. ?  ?- Starting Humira this week (opted for Humira instead of Remicade due to better data with treating hidradenitis suppurativa after discussion with Dermatology) ?- We again reviewed risk/benefit profile of Humira today ?- Micronutrient eval at f/u ?- Drug monitoring ~12 week mark ?- Okay to continue Bentyl prn abdominal cramping ?- Continue regular follow-up with Dermatology ?- Complete hepatitis A/B vaccine series as scheduled ?- Was previously evaluated in the surgical clinic and case previously discussed at multidisciplinary conference. Recommended  medical management rather than surgery for perianal disease ?- RTC in 3 months or sooner as needed ?    ? ?Lavena Bullion ,DO, FACG 07/09/2021, 1:43 PM ? ?

## 2021-07-09 NOTE — Patient Instructions (Addendum)
If you are age 55 or younger, your body mass index should be between 19-25. Your There is no height or weight on file to calculate BMI. If this is out of the aformentioned range listed, please consider follow up with your Primary Care Provider.  ? ?__________________________________________________________ ? ?The [place] [facility] providers would like to encourage you to use [facility] to communicate with providers for non-urgent requests or questions.  Due to long hold times on the telephone, sending your provider a message by [facility] may be a faster and more efficient way to get a response.  Please allow 48 business hours for a response.  Please remember that this is for non-urgent requests.  ? ?Due to recent changes in healthcare laws, you may see the results of your imaging and laboratory studies on MyChart before your provider has had a chance to review them.  We understand that in some cases there may be results that are confusing or concerning to you. Not all laboratory results come back in the same time frame and the provider may be waiting for multiple results in order to interpret others.  Please give [place] 48 hours in order for your provider to thoroughly review all the results before contacting the office for clarification of your results.  ? ?Please follow up in 3 months. Give [place] a call at [phone] to schedule an appointment.  ? ?Thank you for choosing me and [facility]. ? ?Doristine Locks, D.O. ? ?

## 2021-08-12 ENCOUNTER — Encounter: Payer: Self-pay | Admitting: Gastroenterology

## 2021-08-12 NOTE — Telephone Encounter (Signed)
I recommend scheduling an appointment with her PCM to discuss her BP, HR, and possible palpitations.  While Humira can possibly be the cause (elevated BP in 5%, palpitations and tachycardia in <5%), this would be worth monitoring and following up with her PCM.  Otherwise, happy to hear she is having a good response to the Humira. ?

## 2021-09-03 ENCOUNTER — Ambulatory Visit (INDEPENDENT_AMBULATORY_CARE_PROVIDER_SITE_OTHER): Payer: 59 | Admitting: Medical

## 2021-09-03 VITALS — BP 137/90 | HR 70 | Temp 98.2°F | Resp 18 | Ht 62.0 in | Wt 214.2 lb

## 2021-09-03 DIAGNOSIS — E559 Vitamin D deficiency, unspecified: Secondary | ICD-10-CM

## 2021-09-03 DIAGNOSIS — R002 Palpitations: Secondary | ICD-10-CM | POA: Diagnosis not present

## 2021-09-03 DIAGNOSIS — R42 Dizziness and giddiness: Secondary | ICD-10-CM

## 2021-09-03 DIAGNOSIS — R5383 Other fatigue: Secondary | ICD-10-CM | POA: Diagnosis not present

## 2021-09-03 LAB — VITAMIN D 25 HYDROXY (VIT D DEFICIENCY, FRACTURES): VITD: 21.49 ng/mL — ABNORMAL LOW (ref 30.00–100.00)

## 2021-09-03 LAB — CBC WITH DIFFERENTIAL/PLATELET
Basophils Absolute: 0.1 10*3/uL (ref 0.0–0.1)
Basophils Relative: 1.2 % (ref 0.0–3.0)
Eosinophils Absolute: 0.5 10*3/uL (ref 0.0–0.7)
Eosinophils Relative: 7.9 % — ABNORMAL HIGH (ref 0.0–5.0)
HCT: 37.4 % (ref 36.0–46.0)
Hemoglobin: 12.4 g/dL (ref 12.0–15.0)
Lymphocytes Relative: 26.2 % (ref 12.0–46.0)
Lymphs Abs: 1.6 10*3/uL (ref 0.7–4.0)
MCHC: 33.1 g/dL (ref 30.0–36.0)
MCV: 79.4 fl (ref 78.0–100.0)
Monocytes Absolute: 0.6 10*3/uL (ref 0.1–1.0)
Monocytes Relative: 10 % (ref 3.0–12.0)
Neutro Abs: 3.4 10*3/uL (ref 1.4–7.7)
Neutrophils Relative %: 54.7 % (ref 43.0–77.0)
Platelets: 310 10*3/uL (ref 150.0–400.0)
RBC: 4.71 Mil/uL (ref 3.87–5.11)
RDW: 16.7 % — ABNORMAL HIGH (ref 11.5–15.5)
WBC: 6.2 10*3/uL (ref 4.0–10.5)

## 2021-09-03 LAB — COMPREHENSIVE METABOLIC PANEL
ALT: 28 U/L (ref 0–35)
AST: 22 U/L (ref 0–37)
Albumin: 4.1 g/dL (ref 3.5–5.2)
Alkaline Phosphatase: 60 U/L (ref 39–117)
BUN: 17 mg/dL (ref 6–23)
CO2: 25 mEq/L (ref 19–32)
Calcium: 9.6 mg/dL (ref 8.4–10.5)
Chloride: 102 mEq/L (ref 96–112)
Creatinine, Ser: 0.82 mg/dL (ref 0.40–1.20)
GFR: 81.03 mL/min (ref >60.00)
Glucose, Bld: 97 mg/dL (ref 70–99)
Potassium: 3.6 mEq/L (ref 3.5–5.1)
Sodium: 138 mEq/L (ref 135–145)
Total Bilirubin: 0.4 mg/dL (ref 0.2–1.2)
Total Protein: 8.1 g/dL (ref 6.0–8.3)

## 2021-09-03 LAB — IRON: Iron: 48 ug/dL (ref 42–145)

## 2021-09-03 LAB — VITAMIN B12: Vitamin B-12: 340 pg/mL (ref 211–911)

## 2021-09-03 LAB — TSH: TSH: 1.05 u[IU]/mL (ref 0.35–5.50)

## 2021-09-03 LAB — T4, FREE: Free T4: 1.11 ng/dL (ref 0.60–1.60)

## 2021-09-03 LAB — TROPONIN I (HIGH SENSITIVITY): High Sens Troponin I: 3 ng/L (ref 2–17)

## 2021-09-03 MED ORDER — VITAMIN D (ERGOCALCIFEROL) 1.25 MG (50000 UNIT) PO CAPS: 50000.0000 [IU] | ORAL_CAPSULE | ORAL | 0 refills | Status: DC

## 2021-09-03 NOTE — Addendum Note (Signed)
Addended by: Gwenevere Abbot on: 09/03/2021 04:48 PM ? ? Modules accepted: Orders ? ?

## 2021-09-03 NOTE — Progress Notes (Signed)
? ?Subjective:  ? ? Patient ID: Kristin PippinsDonna Wong, female    DOB: 1966/04/15, 56 y.o.   MRN: [id number] ? ?HPI ?Pt in for follow up.  ? ?Pt has been seeing GI MD.  ? ? ?07-09-2021 impression and plan. ? ?IMPRESSION and PLAN:   ?  ?1) Crohn's Disease ?2) Hx of Perianal fistula  ?3) Hidradenitis Suppurativa ?4) Decreased appetite ?5) Diarrhea/Increased to frequency ? ?56 year old female with ileocolonic, penetrating Crohn's Disease with initial response to steroids, but suboptimal response to a second course of steroids. ?  ?- Starting Humira this week (opted for Humira instead of Remicade due to better data with treating hidradenitis suppurativa after discussion with Dermatology) ?- We again reviewed risk/benefit profile of Humira today ?- Micronutrient eval at f/u ?- Drug monitoring ~12 week mark ?- Okay to continue Bentyl prn abdominal cramping ?- Continue regular follow-up with Dermatology ?- Complete hepatitis A/B vaccine series as scheduled ?- Was previously evaluated in the surgical clinic and case previously discussed at multidisciplinary conference. Recommended medical management rather than surgery for perianal disease ?- RTC in 3 months or sooner as needed ? ? ?Pt states she has been tired for month and feeling some light headed sensation intermittently.  ? ?The other day she felt light headed and felt like she was going to pass out/almost like had tunnel vision while cutting vegetable. Was conformable temp.. She state her bp was 155/101 and pulse was 110.  ? ?She has some random fluttering/palpitation sensation at times at night. She states this can happened briefly twice a night. Last night occurred 2 seconds.  ? ? ?Pt states her GI symptoms since she started humera 2-3 months ago. She states will take humera injection this Friday. ? ?Htn-bp controlled today ? ? ?Review of Systems  ?Constitutional:  Positive for fatigue.  ?HENT:  Negative for dental problem.   ?Respiratory:  Negative for cough, choking, chest  tightness, shortness of breath and wheezing.   ?Cardiovascular:  Positive for palpitations. Negative for chest pain.  ?     Last brief event last night.  ?Gastrointestinal:  Negative for abdominal pain, nausea and vomiting.  ?     Gi symptoms better since on humera.  ?Musculoskeletal:  Negative for back pain and neck pain.  ?Skin:  Negative for pallor and rash.  ?Neurological:  Positive for dizziness.  ?     None presently but see hpi.  ?Psychiatric/Behavioral:  Negative for behavioral problems, decreased concentration and hallucinations. The patient is not hyperactive.   ? ? ?Past Medical History:  ?Diagnosis Date  ? Allergy   ? Anxiety   ? Arthritis   ? mild intermittent joint pain. Rt knee and left hip worst.  ? Diabetes mellitus without complication (HCC)   ? "pre diabetic level" on Metformin  ? Environmental allergies   ? Frequent headaches   ? GERD (gastroesophageal reflux disease)   ? she states has heart burn alot.  ? Hidradenitis   ? History of chicken pox   ? Hyperlipidemia   ? Hypertension   ? Seasonal allergies   ? UTI (lower urinary tract infection)   ? ?  ?Social History  ? ?Socioeconomic History  ? Marital status: Married  ?  Spouse name: Not on file  ? Number of children: 3  ? Years of education: Not on file  ? Highest education level: Not on file  ?Occupational History  ? Occupation: [employer]  ?Tobacco Use  ? Smoking status: Never  ? Smokeless tobacco:  Never  ?Vaping Use  ? Vaping Use: Never used  ?Substance and Sexual Activity  ? Alcohol use: Yes  ?  Alcohol/week: 0.0 standard drinks  ?  Comment: rare/occasional.  ? Drug use: No  ? Sexual activity: Yes  ?  Partners: Male  ?Other Topics Concern  ? Not on file  ?Social History Narrative  ? Not on file  ? ?Social Determinants of Health  ? ?Financial Resource Strain: Not on file  ?Food Insecurity: Not on file  ?Transportation Needs: Not on file  ?Physical Activity: Not on file  ?Stress: Not on file  ?Social Connections: Not on file  ?Intimate  Partner Violence: Not on file  ? ? ?Past Surgical History:  ?Procedure Laterality Date  ? AXILLARY SURGERY    ? Sweat glands removed, bilateral 2007  ? CHOLECYSTECTOMY    ? 2002  ? COLONOSCOPY  02/08/2021  ? Vito Cirigl[facility] at [facility]  ? TUBAL LIGATION    ? 1996  ? WISDOM TOOTH EXTRACTION    ? ? ?Family History  ?Problem Relation Age of Onset  ? Hypertension Mother   ?     Living  ? COPD Mother   ? Asthma Mother   ? Scoliosis Mother   ? Heart disease Father 86  ?     Deceased  ? Hyperlipidemia Father   ? Lymphoma Father   ? COPD Father   ? Congestive Heart Failure Father   ? Heart attack Father   ? Anuerysm Father   ? Arthritis Father   ? Uterine cancer Maternal Grandmother   ? Hypertension Maternal Grandmother   ? Aneurysm Paternal Grandfather   ? Heart disease Paternal Grandfather   ? Lung cancer Maternal Grandfather   ? Diabetes Paternal Grandmother   ? COPD Other   ?     Maternal Aunts & Uncles  ? Heart disease Other   ?     Maternal Aunts & Uncles  ? Aneurysm Other   ?     Paternal uncles x3  ? Aneurysm Brother   ?     #1  ? Asthma Brother   ?     #1  ? Diabetes Brother   ?     #1  ? Asthma Brother   ? Breast cancer Sister   ? Colon cancer Sister 47  ? Cardiomyopathy Sister   ? Arthritis Sister   ? Lymphoma Son   ?     Wallace Cull[employer] Cell -- 5 years Remission  ? Scoliosis Daughter   ?     x1  ? Healthy Son   ?     #2  ? Esophageal cancer Neg Hx   ? Stomach cancer Neg Hx   ? Rectal cancer Neg Hx   ? ? ?Allergies  ?Allergen Reactions  ? Anesthetics, Ester Nausea And Vomiting  ? Codeine Anxiety  ? Morphine And Related Anxiety and Nausea And Vomiting  ? Penicillins Hives and Rash  ? ? ?Current Outpatient Medications on File Prior to Visit  ?Medication Sig Dispense Refill  ? chlorthalidone (HYGROTON) 25 MG tablet Take 1 tablet (25 mg total) by mouth daily. 30 tablet 11  ? Multiple Vitamin (MULTIVITAMIN) tablet Take 1 tablet by mouth daily. Reported on 07/03/2015    ? ondansetron (ZOFRAN ODT) 4 MG disintegrating tablet Take 1 tablet  (4 mg total) by mouth every 6 (six) hours. 30 tablet 3  ? pantoprazole (PROTONIX) 40 MG tablet Take 1 tablet (40 mg total) by mouth 2 (two)  times daily. Take 40 mg twice a day for 8 weeks, then reduce to 40 mg daily 90 tablet 3  ? Iron, Ferrous Sulfate, 325 (65 Fe) MG TABS 1 tab po bid 60 tablet 1  ? ?No current facility-administered medications on file prior to visit.  ? ? ?BP 137/90   Pulse 70   Temp 98.2 ?F (36.8 ?C)   Resp 18   Ht 5\' 2"  (1.575 m)   Wt 214 lb 3.2 oz (97.2 kg)   SpO2 99%   BMI 39.18 kg/m?  ?  ?   ?Objective:  ? Physical Exam ? ?General ?Mental Status- Alert. General Appearance- Not in acute distress.  ? ?Skin ?General: Color- Normal Color. Moisture- Normal Moisture. ? ?Neck ?Carotid Arteries- Normal color. Moisture- Normal Moisture. No carotid bruits. No JVD. ? ?Chest and Lung Exam ?Auscultation: ?Breath Sounds:-Normal. ? ?Cardiovascular ?Auscultation:Rythm- Regular. ?Murmurs & Other Heart Sounds:Auscultation of the heart reveals- No Murmurs. ? ?Abdomen ?Inspection:-Inspeection Normal. ?Palpation/Percussion:Note:No mass. Palpation and Percussion of the abdomen reveal- Non Tender, Non Distended + BS, no rebound or guarding. ? ? ? ?Neurologic ?Cranial Nerve exam:- CN III-XII intact(No nystagmus), symmetric smile. ?Drift Test:- No drift. ?Finger to Nose:- Normal/Intact ?Strength:- 5/5 equal and symmetric strength both upper and lower extremities.  ? ? ?   ?Assessment & Plan:  ? ?Patient Instructions  ?For fatigue with episodes of intermittent dizziness over the past months we will get CBC, CMP, B12, B1, TSH, T4, iron level and vitamin D level.  Note history of vitamin D deficiency. ? ?In addition reporting some episodes of palpitations/fluttering that are brief occurring mostly at night.  Near syncope past week with mild to moderate elevated blood pressure abnormal elevated pulse.  EKG today showed normal sinus rhythm.  I do think is best that I go ahead and place referral to cardiologist as  may benefit from zio patch/monitor. ? ?During the interim if you have any current/persistent cardiac type symptoms then be seen in the emergency department. ? ?Hypertension-adequately controlled presently.  

## 2021-09-03 NOTE — Patient Instructions (Addendum)
For fatigue with episodes of intermittent dizziness over the past months we will get CBC, CMP, B12, B1, TSH, T4, iron level and vitamin D level.  Note history of vitamin D deficiency. ? ?In addition reporting some episodes of palpitations/fluttering that are brief occurring mostly at night.  Near syncope past week with mild to moderate elevated blood pressure abnormal elevated pulse.  EKG today showed normal sinus rhythm.  I do think is best that I go ahead and place referral to cardiologist as may benefit from zio patch/monitor. ? ?Did add troponin to lab as well since last flutter event last night. ? ?During the interim if you have any current/persistent cardiac type symptoms then be seen in the emergency department. ? ?Hypertension-adequately controlled presently.  Continue chlorthalidone. ? ?For Crohn's disease continue Humira. ? ?Follow-up in 3 weeks or sooner if needed. ?

## 2021-09-07 LAB — VITAMIN B1: Vitamin B1 (Thiamine): 12 nmol/L (ref 8–30)

## 2021-09-09 ENCOUNTER — Ambulatory Visit (INDEPENDENT_AMBULATORY_CARE_PROVIDER_SITE_OTHER): Payer: 59

## 2021-09-09 ENCOUNTER — Encounter: Payer: Self-pay | Admitting: Internal Medicine

## 2021-09-09 ENCOUNTER — Ambulatory Visit (INDEPENDENT_AMBULATORY_CARE_PROVIDER_SITE_OTHER): Payer: 59 | Admitting: Internal Medicine

## 2021-09-09 VITALS — BP 118/70 | HR 97 | Ht 62.0 in | Wt 215.8 lb

## 2021-09-09 DIAGNOSIS — Z8249 Family history of ischemic heart disease and other diseases of the circulatory system: Secondary | ICD-10-CM | POA: Diagnosis not present

## 2021-09-09 DIAGNOSIS — R42 Dizziness and giddiness: Secondary | ICD-10-CM

## 2021-09-09 DIAGNOSIS — I10 Essential (primary) hypertension: Secondary | ICD-10-CM

## 2021-09-09 DIAGNOSIS — E785 Hyperlipidemia, unspecified: Secondary | ICD-10-CM

## 2021-09-09 DIAGNOSIS — R002 Palpitations: Secondary | ICD-10-CM

## 2021-09-09 DIAGNOSIS — R072 Precordial pain: Secondary | ICD-10-CM | POA: Diagnosis not present

## 2021-09-09 DIAGNOSIS — L732 Hidradenitis suppurativa: Secondary | ICD-10-CM

## 2021-09-09 NOTE — Patient Instructions (Signed)
Medication Instructions:  ?No Changes In Medications at this time.  ?*If you need a refill on your cardiac medications before your next appointment, please call your pharmacy* ? ?Lab Work: ?None Ordered At This Time.  ?If you have labs (blood work) drawn today and your tests are completely normal, you will receive your results only by: ?MyChart Message (if you have MyChart) OR ?A paper copy in the mail ?If you have any lab test that is abnormal or we need to change your treatment, we will call you to review the results. ? ?Testing/Procedures: ? ?Your physician has requested that you have an abdominal aorta duplex. During this test, an ultrasound is used to evaluate the aorta. Allow 30 minutes for this exam. Do not eat after midnight the day before and avoid carbonated beverages ? ?Your physician has requested that you have an echocardiogram. Echocardiography is a painless test that uses sound waves to create images of your heart. It provides your doctor with information about the size and shape of your heart and how well your heart?s chambers and valves are working. You may receive an ultrasound enhancing agent through an IV if needed to better visualize your heart during the echo.This procedure takes approximately one hour. There are no restrictions for this procedure. This will take place at the [address], [address].  ? ? ?ZIO XT- Long Term Monitor Instructions  ? ?Your physician has requested you wear your ZIO patch monitor___14____days.  ? ?This is a single patch monitor.  [employer] supplies one patch monitor per enrollment.  Additional stickers are not available. ?  ?Please do not apply patch if you will be having a Nuclear Stress Test, Echocardiogram, Cardiac CT, MRI, or Chest Xray during the time frame you would be wearing the monitor. The patch cannot be worn during these tests.  You cannot remove and re-apply the ZIO XT patch monitor. ?  ?Your ZIO patch monitor will be sent USPS Priority mail from  [facility] directly to your home address. The monitor may also be mailed to a PO BOX if home delivery is not available.   It may take 3-5 days to receive your monitor after you have been enrolled. ?  ?Once you have received you monitor, please review enclosed instructions.  Your monitor has already been registered assigning a specific monitor serial # to you. ?  ?Applying the monitor  ? ?Shave hair from upper left chest. ?  ?Hold abrader disc by orange tab.  Rub abrader in 40 strokes over left upper chest as indicated in your monitor instructions. ?  ?Clean area with 4 enclosed alcohol pads .  Use all pads to assure are is cleaned thoroughly.  Let dry.  ? ?Apply patch as indicated in monitor instructions.  Patch will be place under collarbone on left side of chest with arrow pointing upward. ?  ?Rub patch adhesive wings for 2 minutes.Remove white label marked "1".  Remove white label marked "2".  Rub patch adhesive wings for 2 additional minutes. ?  ?While looking in a mirror, press and release button in center of patch.  A small green light will flash 3-4 times .  This will be your only indicator the monitor has been turned on. ?    ?Do not shower for the first 24 hours.  You may shower after the first 24 hours. ?  ?Press button if you feel a symptom. You will hear a small click.  Record Date, Time and Symptom in  the Patient Log Book. ?  ?When you are ready to remove patch, follow instructions on last 2 pages of Patient Log Book.  Stick patch monitor onto last page of Patient Log Book. ?  ?Place Patient Log Book in [facility] box.  Use locking tab on box and tape box closed securely.  The [employer] has [employer] on it.  Please place in mailbox as soon as possible.  Your physician should have your test results approximately 7 days after the monitor has been mailed back to [facility]. ?  ?Call [facility] at [phone] if you have questions regarding your ZIO XT patch  monitor.  Call them immediately if you see an orange light blinking on your monitor. ?  ?If your monitor falls off in less than 4 days contact our Monitor department at [phone].  If your monitor becomes loose or falls off after 4 days call [employer] at [phone] for suggestions on securing your monitor.  ? ? ?How to Prepare for Your Cardiac PET/CT Stress Test: ? ?1. Please do not take these medications before your test:  ? ?Medications that may interfere with the cardiac pharmacological stress agent (ex. nitrates or beta-blockers) the day of the exam. ?Theophylline containing medications for 12 hours. ?Dipyridamole 48 hours prior to the test. ?Your remaining medications may be taken with water. ? ?2. Nothing to eat or drink, except water, 3 hours prior to arrival time.   ?NO caffeine/decaffeinated products, or chocolate 12 hours prior to arrival. ? ?3. NO perfume, cologne or lotion ? ?4. Total time is 1 to 2 hours; you may want to bring reading material for the waiting time. ? ?5. Please report to Admitting at the [facility] 60 minutes early for your test. ? [address], [place] [address] ? ?Diabetic Preparation:  ?Hold oral medications. ?You may take NPH and Lantus insulin. ?Do not take Humalog or Humulin R (Regular Insulin) the day of your test. ?Check blood sugars prior to leaving the house. ?If able to eat breakfast prior to 3 hour fasting, you may take all medications, including your insulin, ?Do not worry if you miss your breakfast dose of insulin - start at your next meal. ? ?IF YOU THINK YOU MAY BE PREGNANT, OR ARE NURSING PLEASE INFORM THE TECHNOLOGIST. ? ?In preparation for your appointment, medication and supplies will be purchased.  Appointment availability is limited, so if you need to cancel or reschedule, please call the Radiology Department at [phone]  24 hours in advance to avoid a cancellation fee of $100.00 ? ?What to Expect After you  Arrive: ? ?Once you arrive and check in for your appointment, you will be taken to a preparation room within the Radiology Department.  A technologist or Nurse will obtain your medical history, verify that you are correctly prepped for the exam, and explain the procedure.  Afterwards,  an IV will be started in your arm and electrodes will be placed on your skin for EKG monitoring during the stress portion of the exam. Then you will be escorted to the PET/CT scanner.  There, staff will get you positioned on the scanner and obtain a blood pressure and EKG.  During the exam, you will continue to be connected to the EKG and blood pressure machines.  A small, safe amount of a radioactive tracer will be injected in your IV to obtain a series of pictures of your heart along with an injection of a  stress agent.   ? ?After your Exam: ? ?It is recommended that you eat a meal and drink a caffeinated beverage to counter act any effects of the stress agent.  Drink plenty of fluids for the remainder of the day and urinate frequently for the first couple of hours after the exam.  Your doctor will inform you of your test results within 7-10 business days. ? ?For questions about your test or how to prepare for your test, please call: ?Marchia Bond, Cardiac Imaging Nurse Navigator  ?Gordy Clement, Cardiac Imaging Nurse Navigator ?Office: [phone] ? ? ?Follow-Up: ?At [facility], you and your health needs are our priority.  As part of our continuing mission to provide you with exceptional heart care, we have created designated Provider Care Teams.  These Care Teams include your primary Cardiologist (physician) and Advanced Practice Providers (APPs -  Physician Assistants and Nurse Practitioners) who all work together to provide you with the care you need, when you need it. ? ?Your next appointment:   ?July 25th at [place] ? ?The format for your next appointment:   ?In Person ? ?Provider:   ?Elouise Munroe, MD   ? ? ? ? ? ?

## 2021-09-09 NOTE — Progress Notes (Unsigned)
Enrolled for [employer] to mail a ZIO XT long term holter monitor to the patients address on file.  

## 2021-09-09 NOTE — Progress Notes (Signed)
Cardiology Office Note:    Date:  09/09/2021   ID:  Kristin Wong, DOB 1965-10-23, MRN [id number]  PCP:  Mackie Pai, PA-C  Cardiologist:  Elouise Munroe, MD  Electrophysiologist:  None   Referring MD: Elise Benne   Chief Complaint/Reason for Referral: Palpitations, Dizziness  History of Present Illness:    Kristin Wong is a 56 y.o. female with a history of hypertension, hyperlipidemia, diabetes mellitus, GERD, arthritis, and hidradenitis suppurativa, here for the evaluation of palpitations and dizziness at the request of Mackie Pai, PA-C.  She was seen by Mackie Pai, PA-C on 09/03/2021 and reported episodes of intermittent dizziness over a few months prior. She also noted brief, mostly nocturnal episodes of palpitations/fluttering. The week before she was near syncopal with elevated BP and pulse. She was referred to cardiology for further evaluation and consideration of Zio monitor.  Today: For many months she has not felt like herself. She was recently diagnosed with Crohn's and colitis.   She notes that she suffered a severe HS flare-up about 6 months before her GI issues developed in late August/early September. Around that time she began feeling very lethargic, enough to affect her ADL's. She also noted more foods were causing indigestion. By the end of September she was anemic. Lately she has felt better, but her GI issues have not completely resolved. However, she states her HS is very active.  Regarding her near syncopal episode, she noticed associated flushed feeling, nausea, tremors, and lightheadedness. This suddenly occurred as she was preparing dinner one night. She went to sit down and found her BP was elevated, with a heart rate of 115 bpm. Her normal resting heart rate is in the 80's. She felt "really strange" for 20 minutes, and then continued making dinner. This recurred a few weeks later.  At night when she lies down she experiences episodes of a  "really pronounced flutter", worse when she lies on her left side. This usually every night, more noticeable now as it takes a while for her to fall asleep. Simultaneously she feels a pinching pain inferior to her left breast or near her collarbone. She also describes this pain as "a thick-skin pinch". Associated symptoms also include lightheadedness if she tries to sit up on the side of the bed (room spinning). Sometimes her lightheadedness resolves once she is sitting fully upright. She is not sure if she is orthostatic. Of note, about 2 months ago she started Humira; recently received her 5th injection. She believes her palpitations began after she started Humira.  If she does not take her antihypertensives she will develop some edema. None at this time. Usually her blood pressure is well controlled at home.  Typically she is not very active. She enjoys some gardening.  Her mother had a PPM. Her father had CABG x2 and several heart attacks. Her brother had a heart attack. Also a family history of AAA in males of her family.  The patient denies dyspnea at rest or with exertion, PND, orthopnea, or leg swelling. Denies cough, fever, chills. Denies vomiting. Denies snoring.    Past Medical History:  Diagnosis Date   Allergy    Anxiety    Arthritis    mild intermittent joint pain. Rt knee and left hip worst.   Diabetes mellitus without complication ([place])    "pre diabetic level" on Metformin   Environmental allergies    Frequent headaches    GERD (gastroesophageal reflux disease)    she states has heart burn  alot.   Hidradenitis    History of chicken pox    Hyperlipidemia    Hypertension    Seasonal allergies    UTI (lower urinary tract infection)     Past Surgical History:  Procedure Laterality Date   AXILLARY SURGERY     Sweat glands removed, bilateral 2007   CHOLECYSTECTOMY     2002   COLONOSCOPY  02/08/2021   Vito Cirigliano at [place]  EXTRACTION      Current Medications: Current Meds  Medication Sig   chlorthalidone (HYGROTON) 25 MG tablet Take 1 tablet (25 mg total) by mouth daily.   Iron, Ferrous Sulfate, 325 (65 Fe) MG TABS 1 tab po bid   Multiple Vitamin (MULTIVITAMIN) tablet Take 1 tablet by mouth daily. Reported on 07/03/2015   ondansetron (ZOFRAN ODT) 4 MG disintegrating tablet Take 1 tablet (4 mg total) by mouth every 6 (six) hours.   pantoprazole (PROTONIX) 40 MG tablet Take 1 tablet (40 mg total) by mouth 2 (two) times daily. Take 40 mg twice a day for 8 weeks, then reduce to 40 mg daily   Vitamin D, Ergocalciferol, (DRISDOL) 1.25 MG (50000 UNIT) CAPS capsule Take 1 capsule (50,000 Units total) by mouth every 7 (seven) days.     Allergies:   Anesthetics, ester; Codeine; Morphine and related; and Penicillins   Social History   Tobacco Use   Smoking status: Never   Smokeless tobacco: Never  Vaping Use   Vaping Use: Never used  Substance Use Topics   Alcohol use: Yes    Alcohol/week: 0.0 standard drinks    Comment: rare/occasional.   Drug use: No     Family History: The patient's family history includes Aneurysm in her brother, paternal grandfather, and another family member; Anuerysm in her father; Arthritis in her father and sister; Asthma in her brother, brother, and mother; Breast cancer in her sister; COPD in her father, mother, and another family member; Cardiomyopathy in her sister; Colon cancer (age of onset: 3) in her sister; Congestive Heart Failure in her father; Diabetes in her brother and paternal grandmother; Healthy in her son; Heart attack in her father; Heart disease in her paternal grandfather and another family member; Heart disease (age of onset: 43) in her father; Hyperlipidemia in her father; Hypertension in her maternal grandmother and mother; Lung cancer in her maternal grandfather; Lymphoma in her father and son; Scoliosis in her daughter and mother; Uterine cancer in her maternal  grandmother. There is no history of Esophageal cancer, Stomach cancer, or Rectal cancer.  ROS:   Please see the history of present illness.    (+) Palpitations (+) Chest pain (+) Presyncope (+) Lightheadedness All other systems reviewed and are negative.  EKGs/Labs/Other Studies Reviewed:    The following studies were reviewed today: No prior cardiovascular studies available.  CT Abdomen/Pelvis 06/11/2021 ([place]): FINDINGS:  Lower chest: No acute abnormality   Hepatobiliary: No focal liver abnormality is seen. Status post  cholecystectomy. No biliary dilatation.   Pancreas: No focal abnormality or ductal dilatation.   Spleen: No focal abnormality.  Normal size.   Adrenals/Urinary Tract: No adrenal abnormality. No focal renal  abnormality. No stones or hydronephrosis. Urinary bladder is  unremarkable.   Stomach/Bowel: There is wall thickening and surrounding inflammation  within the proximal to mid sigmoid colon concerning for infectious  or inflammatory colitis. Stomach and small bowel decompressed,  unremarkable.   Vascular/Lymphatic:  No evidence of aneurysm or adenopathy.   Reproductive: Uterus and adnexa unremarkable.  No mass.   Other: No free fluid or free air.   Musculoskeletal: No acute bony abnormality.   IMPRESSION:  Wall thickening and surrounding stranding involving the proximal to  mid sigmoid colon most compatible with infectious or inflammatory  colitis.    EKG:  EKG is personally reviewed. 09/09/2021: Sinus rhythm. Anterior infarct.  Imaging studies that I have independently reviewed today: n/a  Recent Labs: 09/03/2021: ALT 28; BUN 17; Creatinine, Ser 0.82; Hemoglobin 12.4; Platelets 310.0; Potassium 3.6; Sodium 138; TSH 1.05   Recent Lipid Panel    Component Value Date/Time   CHOL 183 12/28/2020 1123   TRIG 126.0 12/28/2020 1123   HDL 54.90 12/28/2020 1123   CHOLHDL 3 12/28/2020 1123   VLDL 25.2 12/28/2020 1123   LDLCALC 103 (H)  12/28/2020 1123   LDLDIRECT 131.0 05/17/2019 0856    Physical Exam:    VS:  BP 118/70   Pulse 97   Ht 5\' 2"  (1.575 m)   Wt 215 lb 12.8 oz (97.9 kg)   SpO2 98%   BMI 39.47 kg/m     Wt Readings from Last 5 Encounters:  09/09/21 215 lb 12.8 oz (97.9 kg)  09/03/21 214 lb 3.2 oz (97.2 kg)  07/09/21 215 lb 6 oz (97.7 kg)  06/13/21 222 lb 6.4 oz (100.9 kg)  05/10/21 232 lb (105.2 kg)    Constitutional: No acute distress Eyes: sclera non-icteric, normal conjunctiva and lids ENMT: normal dentition, moist mucous membranes Cardiovascular: regular rhythm, normal rate, no murmur. S1 and S2 normal. No jugular venous distention.  Respiratory: clear to auscultation bilaterally GI : normal bowel sounds, soft and nontender. No distention.   MSK: extremities warm, well perfused. No edema.  NEURO: grossly nonfocal exam, moves all extremities. PSYCH: alert and oriented x 3, normal mood and affect.   ASSESSMENT:    1. Precordial pain   2. Palpitations   3. Dizziness   4. Family history of abdominal aortic aneurysm (AAA)   5. Hidradenitis suppurativa   6. Primary hypertension   7. Hyperlipidemia, unspecified hyperlipidemia type    PLAN:    Precordial pain - Plan: EKG 12-Lead, NM PET CT CARDIAC PERFUSION MULTI W/ABSOLUTE BLOODFLOW, ECHOCARDIOGRAM COMPLETE - She has chest pain which is possibly cardiac. We discussed that a cardiac PET CT MPI may be the best test to evaluate. If her chest pain escalates, we will obtain SPECT MPI sooner given scheduling availability. Consider CCTA however with supine palpitations and resting elevated HR, may not be optimal test.   Dizziness Palpitations - Plan: EKG 12-Lead, ECHOCARDIOGRAM COMPLETE, LONG TERM MONITOR (3-14 DAYS) - will obtain echo and monitor to ensure no atrial fibrillation or malignant arrhythmia given dizziness. She has experienced near syncope as well.   Family history of abdominal aortic aneurysm (AAA) - Plan: VAS [place] AAA DUPLEX - offered  age related screening given family history.   HTN - well controlled on chlorthalidone 25 mg daily.  HLD - LDL above goal of <70, will discussed therapeutic options after testing complete, will obtain qualitative calcium score with PET CT MPI.  Follow-up in 2 months.  Total time of encounter: 40 minutes total time of encounter, including 30 minutes spent in face-to-face patient care on the date of this encounter. This time includes coordination of care and counseling regarding above mentioned problem list. Remainder of non-face-to-face time involved reviewing chart documents/testing relevant to the patient encounter and documentation in  the medical record. I have independently reviewed documentation from referring provider.   Cherlynn Kaiser, MD, [place]   Shared Decision Making/Informed Consent:   Shared Decision Making/Informed Consent The risks [chest pain, shortness of breath, cardiac arrhythmias, dizziness, blood pressure fluctuations, myocardial infarction, stroke/transient ischemic attack, nausea, vomiting, allergic reaction, radiation exposure, metallic taste sensation and life-threatening complications (estimated to be 1 in 10,000)], benefits (risk stratification, diagnosing coronary artery disease, treatment guidance) and alternatives of a cardiac PET stress test were discussed in detail with Ms. Rossetti and she agrees to proceed.   Medication Adjustments/Labs and Tests Ordered: Current medicines are reviewed at length with the patient today.  Concerns regarding medicines are outlined above.   Orders Placed This Encounter  Procedures   NM PET CT CARDIAC PERFUSION MULTI W/ABSOLUTE BLOODFLOW   LONG TERM MONITOR (3-14 DAYS)   EKG 12-Lead   ECHOCARDIOGRAM COMPLETE   VAS [place] AAA DUPLEX    No orders of the defined types were placed in this encounter.   Patient Instructions  Medication Instructions:  No Changes In Medications at this time.  *If you need a  refill on your cardiac medications before your next appointment, please call your pharmacy*  Lab Work: None Ordered At This Time.  If you have labs (blood work) drawn today and your tests are completely normal, you will receive your results only by: [place] (if you have MyChart) OR A paper copy in the mail If you have any lab test that is abnormal or we need to change your treatment, we will call you to review the results.  Testing/Procedures:  Your physician has requested that you have an abdominal aorta duplex. During this test, an ultrasound is used to evaluate the aorta. Allow 30 minutes for this exam. Do not eat after midnight the day before and avoid carbonated beverages  Your physician has requested that you have an echocardiogram. Echocardiography is a painless test that uses sound waves to create images of your heart. It provides your doctor with information about the size and shape of your heart and how well your heart's chambers and valves are working. You may receive an ultrasound enhancing agent through an IV if needed to better visualize your heart during the echo.This procedure takes approximately one hour. There are no restrictions for this procedure. This will take place at the [address].    ZIO XT- Long Term Monitor Instructions   Your physician has requested you wear your ZIO patch monitor___14____days.   This is a single patch monitor.  [employer] supplies one patch monitor per enrollment.  Additional stickers are not available.   Please do not apply patch if you will be having a Nuclear Stress Test, Echocardiogram, Cardiac CT, MRI, or Chest Xray during the time frame you would be wearing the monitor. The patch cannot be worn during these tests.  You cannot remove and re-apply the ZIO XT patch monitor.   Your ZIO patch monitor will be sent USPS Priority mail from [facility] directly to your home address. The monitor may also be mailed to a  PO BOX if home delivery is not available.   It may take 3-5 days to receive your monitor after you have been enrolled.   Once you have received you monitor, please review enclosed instructions.  Your monitor has already been registered assigning a specific monitor serial # to you.   Applying the monitor   Shave hair from upper left  chest.   Hold abrader disc by orange tab.  Rub abrader in 40 strokes over left upper chest as indicated in your monitor instructions.   Clean area with 4 enclosed alcohol pads .  Use all pads to assure are is cleaned thoroughly.  Let dry.   Apply patch as indicated in monitor instructions.  Patch will be place under collarbone on left side of chest with arrow pointing upward.   Rub patch adhesive wings for 2 minutes.Remove white label marked "1".  Remove white label marked "2".  Rub patch adhesive wings for 2 additional minutes.   While looking in a mirror, press and release button in center of patch.  A small green light will flash 3-4 times .  This will be your only indicator the monitor has been turned on.     Do not shower for the first 24 hours.  You may shower after the first 24 hours.   Press button if you feel a symptom. You will hear a small click.  Record Date, Time and Symptom in the Patient Log Book.   When you are ready to remove patch, follow instructions on last 2 pages of Patient Log Book.  Stick patch monitor onto last page of Patient Log Book.   Place Patient Log Book in [place] box.  Use locking tab on box and tape box closed securely.  The [employer] has [employer] on it.  Please place in mailbox as soon as possible.  Your physician should have your test results approximately 7 days after the monitor has been mailed back to [facility].   Call [place] at [phone] if you have questions regarding your ZIO XT patch monitor.  Call them immediately if you see an orange light blinking on your monitor.   If  your monitor falls off in less than 4 days contact our Monitor department at [phone].  If your monitor becomes loose or falls off after 4 days call [employer] at [phone] for suggestions on securing your monitor.    How to Prepare for Your Cardiac PET/CT Stress Test:  1. Please do not take these medications before your test:   Medications that may interfere with the cardiac pharmacological stress agent (ex. nitrates or beta-blockers) the day of the exam. Theophylline containing medications for 12 hours. Dipyridamole 48 hours prior to the test. Your remaining medications may be taken with water.  2. Nothing to eat or drink, except water, 3 hours prior to arrival time.   NO caffeine/decaffeinated products, or chocolate 12 hours prior to arrival.  3. NO perfume, cologne or lotion  4. Total time is 1 to 2 hours; you may want to bring reading material for the waiting time.  5. Please report to Admitting at the [facility] Main Entrance 60 minutes early for your test.  [place], [place] [address]  Diabetic Preparation:  Hold oral medications. You may take NPH and Lantus insulin. Do not take Humalog or Humulin R (Regular Insulin) the day of your test. Check blood sugars prior to leaving the house. If able to eat breakfast prior to 3 hour fasting, you may take all medications, including your insulin, Do not worry if you miss your breakfast dose of insulin - start at your next meal.  IF YOU THINK YOU MAY BE PREGNANT, OR ARE NURSING PLEASE INFORM THE TECHNOLOGIST.  In preparation for your appointment, medication and supplies will be purchased.  Appointment availability is limited,  so if you need to cancel or reschedule, please call the Radiology Department at [phone]  24 hours in advance to avoid a cancellation fee of $100.00  What to Expect After you Arrive:  Once you arrive and check in for your appointment, you will be taken to a preparation room  within the Radiology Department.  A technologist or Nurse will obtain your medical history, verify that you are correctly prepped for the exam, and explain the procedure.  Afterwards,  an IV will be started in your arm and electrodes will be placed on your skin for EKG monitoring during the stress portion of the exam. Then you will be escorted to the PET/CT scanner.  There, staff will get you positioned on the scanner and obtain a blood pressure and EKG.  During the exam, you will continue to be connected to the EKG and blood pressure machines.  A small, safe amount of a radioactive tracer will be injected in your IV to obtain a series of pictures of your heart along with an injection of a stress agent.    After your Exam:  It is recommended that you eat a meal and drink a caffeinated beverage to counter act any effects of the stress agent.  Drink plenty of fluids for the remainder of the day and urinate frequently for the first couple of hours after the exam.  Your doctor will inform you of your test results within 7-10 business days.  For questions about your test or how to prepare for your test, please call: Marchia Bond, Cardiac Imaging Nurse Navigator  Gordy Clement, Cardiac Imaging Nurse Navigator Office: [phone]   Follow-Up: At [facility], you and your health needs are our priority.  As part of our continuing mission to provide you with exceptional heart care, we have created designated Provider Care Teams.  These Care Teams include your primary Cardiologist (physician) and Advanced Practice Providers (APPs -  Physician Assistants and Nurse Practitioners) who all work together to provide you with the care you need, when you need it.  Your next appointment:   July 25th at [place]  The format for your next appointment:   In Person  Provider:   Elouise Munroe, MD           [facility],acting as a scribe for Elouise Munroe, MD.,have documented all relevant  documentation on the behalf of Elouise Munroe, MD,as directed by  Elouise Munroe, MD while in the presence of Elouise Munroe, MD.  I, Elouise Munroe, MD, have reviewed all documentation for the visit on 09/09/2021. The documentation on today's date of service for the exam, diagnosis, procedures, and orders are all accurate and complete.

## 2021-09-10 ENCOUNTER — Ambulatory Visit (HOSPITAL_BASED_OUTPATIENT_CLINIC_OR_DEPARTMENT_OTHER)
Admission: RE | Admit: 2021-09-10 | Discharge: 2021-09-10 | Disposition: A | Discharge status: Home / Self Care | Payer: 59 | Source: Ambulatory Visit | Attending: Internal Medicine | Admitting: Internal Medicine

## 2021-09-10 DIAGNOSIS — R072 Precordial pain: Secondary | ICD-10-CM | POA: Insufficient documentation

## 2021-09-10 DIAGNOSIS — R42 Dizziness and giddiness: Secondary | ICD-10-CM | POA: Insufficient documentation

## 2021-09-10 DIAGNOSIS — R002 Palpitations: Secondary | ICD-10-CM | POA: Diagnosis not present

## 2021-09-10 LAB — ECHOCARDIOGRAM COMPLETE
AR max vel: 2.93 cm2
AV Area VTI: 3.11 cm2
AV Area mean vel: 3.03 cm2
AV Mean grad: 3 mmHg
AV Peak grad: 4.8 mmHg
Ao pk vel: 1.1 m/s
Area-P 1/2: 4.36 cm2
S' Lateral: 2.1 cm

## 2021-09-10 NOTE — Progress Notes (Signed)
?  Echocardiogram ?2D Echocardiogram has been performed. ? ?Kristin Wong ?09/10/2021, 2:00 PM ?

## 2021-09-12 DIAGNOSIS — R002 Palpitations: Secondary | ICD-10-CM | POA: Diagnosis not present

## 2021-09-12 DIAGNOSIS — R42 Dizziness and giddiness: Secondary | ICD-10-CM

## 2021-09-19 ENCOUNTER — Encounter: Payer: Self-pay | Admitting: Internal Medicine

## 2021-09-24 ENCOUNTER — Inpatient Hospital Stay (HOSPITAL_COMMUNITY): Admission: RE | Admit: 2021-09-24 | Payer: 59 | Source: Ambulatory Visit

## 2021-09-24 ENCOUNTER — Ambulatory Visit: Payer: 59 | Admitting: Medical

## 2021-09-30 ENCOUNTER — Encounter: Payer: Self-pay | Admitting: Gastroenterology

## 2021-09-30 DIAGNOSIS — R198 Other specified symptoms and signs involving the digestive system and abdomen: Secondary | ICD-10-CM

## 2021-09-30 DIAGNOSIS — K50919 Crohn's disease, unspecified, with unspecified complications: Secondary | ICD-10-CM

## 2021-09-30 NOTE — Telephone Encounter (Signed)
Plan for the following:  - Check CRP, fecal calprotectin, GI PCR panel - Check adalimumab trough and antibody level.  Plan to draw this lab 2-3 days prior to next injection dose - Ok to [facility] into clinic spot next week with me or with one of the APP's if they have sooner availability.  Plan to discuss either shortening Humira to weekly, adding a second agent, or changing therapy altogether depending on lab results above

## 2021-10-02 ENCOUNTER — Other Ambulatory Visit (HOSPITAL_COMMUNITY): Payer: 59

## 2021-10-02 ENCOUNTER — Other Ambulatory Visit (INDEPENDENT_AMBULATORY_CARE_PROVIDER_SITE_OTHER): Payer: 59

## 2021-10-02 DIAGNOSIS — R198 Other specified symptoms and signs involving the digestive system and abdomen: Secondary | ICD-10-CM

## 2021-10-02 DIAGNOSIS — K50919 Crohn's disease, unspecified, with unspecified complications: Secondary | ICD-10-CM

## 2021-10-03 ENCOUNTER — Other Ambulatory Visit: Payer: 59

## 2021-10-03 ENCOUNTER — Other Ambulatory Visit: Payer: Self-pay | Admitting: *Deleted

## 2021-10-03 DIAGNOSIS — R198 Other specified symptoms and signs involving the digestive system and abdomen: Secondary | ICD-10-CM

## 2021-10-03 DIAGNOSIS — K50919 Crohn's disease, unspecified, with unspecified complications: Secondary | ICD-10-CM

## 2021-10-03 LAB — C-REACTIVE PROTEIN: CRP: 43 mg/L — ABNORMAL HIGH (ref 0–10)

## 2021-10-05 LAB — GI PROFILE, STOOL, PCR
Adenovirus F 40/41: NOT DETECTED
Astrovirus: NOT DETECTED
C difficile toxin A/B: NOT DETECTED
Campylobacter: NOT DETECTED
Cryptosporidium: NOT DETECTED
Cyclospora cayetanensis: NOT DETECTED
Entamoeba histolytica: NOT DETECTED
Enteroaggregative E coli: NOT DETECTED
Enteropathogenic E coli: NOT DETECTED
Enterotoxigenic E coli: NOT DETECTED
Giardia lamblia: NOT DETECTED
Norovirus GI/GII: NOT DETECTED
Plesiomonas shigelloides: NOT DETECTED
Rotavirus A: DETECTED — AB
Salmonella: NOT DETECTED
Sapovirus: NOT DETECTED
Shiga-toxin-producing E coli: NOT DETECTED
Shigella/Enteroinvasive E coli: NOT DETECTED
Vibrio cholerae: NOT DETECTED
Vibrio: NOT DETECTED
Yersinia enterocolitica: NOT DETECTED

## 2021-10-07 ENCOUNTER — Encounter: Payer: Self-pay | Admitting: Gastroenterology

## 2021-10-07 ENCOUNTER — Telehealth: Payer: Self-pay | Admitting: Pharmacy Technician

## 2021-10-07 ENCOUNTER — Ambulatory Visit (INDEPENDENT_AMBULATORY_CARE_PROVIDER_SITE_OTHER): Payer: 59 | Admitting: Gastroenterology

## 2021-10-07 VITALS — BP 130/80 | HR 86 | Ht 62.0 in | Wt 209.4 lb

## 2021-10-07 DIAGNOSIS — K50919 Crohn's disease, unspecified, with unspecified complications: Secondary | ICD-10-CM | POA: Insufficient documentation

## 2021-10-07 DIAGNOSIS — K219 Gastro-esophageal reflux disease without esophagitis: Secondary | ICD-10-CM

## 2021-10-07 DIAGNOSIS — L732 Hidradenitis suppurativa: Secondary | ICD-10-CM

## 2021-10-07 DIAGNOSIS — K603 Anal fistula: Secondary | ICD-10-CM

## 2021-10-07 DIAGNOSIS — K50113 Crohn's disease of large intestine with fistula: Secondary | ICD-10-CM

## 2021-10-07 LAB — ADALIMUMAB+AB (SERIAL MONITOR)
Adalimumab Drug Level: <0.6 ug/mL
Anti-Adalimumab Antibody: 270 ng/mL

## 2021-10-07 LAB — SERIAL MONITORING

## 2021-10-07 MED ORDER — METHOTREXATE (PF) 20 MG/0.4ML ~~LOC~~ SOAJ: 0.4000 | SUBCUTANEOUS | 2 refills | Status: DC

## 2021-10-07 NOTE — Patient Instructions (Addendum)
If you are age 55 or younger, your body mass index should be between 19-25. Your Body mass index is 38.3 kg/m. If this is out of the aformentioned range listed, please consider follow up with your Primary Care Provider.   __________________________________________________________  The [facility] providers would like to encourage you to use [facility] to communicate with providers for non-urgent requests or questions.  Due to long hold times on the telephone, sending your provider a message by [facility] may be a faster and more efficient way to get a response.  Please allow 48 business hours for a response.  Please remember that this is for non-urgent requests.    Due to recent changes in healthcare laws, you may see the results of your imaging and laboratory studies on MyChart before your provider has had a chance to review them.  We understand that in some cases there may be results that are confusing or concerning to you. Not all laboratory results come back in the same time frame and the provider may be waiting for multiple results in order to interpret others.  Please give [place] 48 hours in order for your provider to thoroughly review all the results before contacting the office for clarification of your results.    We have sent the following medications to your pharmacy for you to pick up at your convenience: methotrexate, dexilant  Please Stop taking Protonix.  Pharmacy will contact will after Prior Authorization is approved to start Remicade infusion.  Please follow up in 2 months. Give [place] a call at [phone] to schedule an appointment.   Thank you for choosing me and [facility].  Vito Cirigliano, D.O.

## 2021-10-07 NOTE — Telephone Encounter (Addendum)
Dr. Barron Alvine,  [facility] preferred infliximab product is avsola due to contract pricing.  May we switch to Avsola?     Auth Submission: APPROVED Payer: FRIDAY Medication & CPT/J Code(s) submitted: Avsola (infliximab-axxq) [phone] Route of submission (phone, fax, portal): PHONE: [phone] FAX: [phone] Auth type: Buy/Bill Units/visits requested: 5MG /KG Reference number: Approval from:  10/07/21  to  10/08/22  Will update once we receive a response.  AVSOLA CO-PAY CARD: APPROVED  ID: 12/08/22  PCN: [id number] GR: [id number] DEBIT CARD ID: [id number] CVC: 304 EXP: 02/25/26 Approval 10/07/21 - 10/06/24 MAX YR: 15,000/YR (reset every year) FAX: [phone] PHONE: [phone] Card will be sent to [facility]

## 2021-10-07 NOTE — Telephone Encounter (Addendum)
Once we receive approval, we will schedule the patient as soon as possible. Thanks Selena Batten

## 2021-10-07 NOTE — Progress Notes (Signed)
Chief Complaint:    Crohn's disease  GI History: 56 year old female with a history of HTN, anxiety, diabetes, HLD, arthritis, GERD, obesity (BMI 44), hidradenitis suppurativa s/p axillary surgery 2007, cholecystectomy 2002, initially seen in GI clinic on 02/05/2021 for evaluation of IDA, FOBT positive stool, fatigue, abdominal pain, change in bowel habits.  Developed GI symptoms in 12/2020 described as upper abdominal pain, increased gas/bloating, mucus-like stools, decreased appetite, postprandial abdominal cramping.  Started develop blood in her stools with increased urgency but no change in frequency.   - 12/28/2020: H/H 11.2/34.8, MCV/RDW 72/18.  Iron 23.  Normal B12, vitamin D, vitamin B1.  Normal CMP.  Started on oral iron - 01/11/2021: FOBT positive - 01/21/2021: H/H 12.3/38.9, MCV/RDW 73.6/20.6.  Iron 51 - 02/05/2021: Evaluated in the GI clinic.  Negative/normal GI PCR.  Fecal calprotectin 3460, ESR 59, CRP 91 - 02/08/2021: EGD: LA Grade A esophagitis, moderate antral gastritis (path: Gastritis), Moderate inflammation in duodenal bulb (path: Peptic duodenitis).  Started on Protonix 40 mg bid - 02/08/2021: Colonoscopy: Moderate inflammation in the sigmoid (altered vascularity, congestion, erythema, aphthous ulcerations and deep ulcerations; path: Severe active colitis), mild inflammation of the rectum and cecum (altered vascularity, congestion, and erythema; path: Focal active proctitis), sigmoid diverticulosis, a few mild aphthous ulcers in TI (path: Normal), Perianal fistula and skin tags.  Started on prednisone 40 mg/day x2 weeks with taper.  Referred for MRI and additional labs - 02/08/2021: TPMT 10, QuantiFERON gold negative, HBsAb-, HBsAg- - 02/25/2021: MRI pelvis: 1.5 cm intersphincteric perianal abscess along anterior wall of upper anus, sigmoid diverticulosis with mild colonic wall thickening/soft tissue stranding.  Endometrial polyps or intracavitary fibroids - 03/25/2021: Evaluated by  Dr. Johney Maine in [facility].  Plan for medical management - 06/11/2021: CT A/P: Wall thickening and surrounding stranding involving the proximal to mid sigmoid colon.  Treated in ER with Levaquin, dicyclomine - 06/13/2021: GI clinic follow-up as above.  Still with loose bloody stools.  Reports last response to prednisone.  GI PCR panel positive for norovirus.  ESR 95, CRP 16.9.  Fecal calprotectin not collected. - 07/09/2021: GI follow-up.  Started on Humira   Separately, history of GERD controlled dietary modifications and occasional Tums.  Rarely uses Protonix.  Sister with Colon Cancer, diagnosed ~age 63. Niece with UC. Father died with Lymphoma; son with hx of Lymphoma.  HPI:     Patient is a 56 y.o. female presenting to the Gastroenterology Clinic for follow-up.  Last seen by me on 07/09/2021.  Started on Humira at that time.  Reports was doing better for the first month or so.  Felt well during the loading dose, but increasing symptoms recently, again described as mucus-like stools, lower abdominal pain, occasional blood mixed in with stool, and nausea.  Tends to get better after Humira injection.  Ordered for stool studies, inflammatory markers, and adalimumab trough/antibody levels.  Labs collected on 10/02/2021 (2 days prior to Humira dose):   -Adalimumab trough: Pending - Adalimumab antibody: 270 (intermediate titer) - CRP 43 - Fecal calprotectin: Pending - GI PCR panel: Rotavirus detected  Having 5-6 BMs in AM and another 3-4 in PM, with 1 nocturnal stool. She has noticed her hidradenitis suppurativa and arthralgias are also flaring lately.  Has pain in B/l hands, ankles, knees.  Additionally, 2 small oral ulcers.  Did develop a mild periumbilical rash, but no longer present.  Does have intermittent drainage from perceived perianal fistula, but also has genital hidradenitis symptoms.  Her hidradenitis suppurativa has been  uncontrolled.  She needs to schedule a follow-up appointment with  Dermatology.  Has been following in the Cardiology Clinic, last seen on 09/09/2021.  EKG performed that day.  TTE on 09/10/2021 was normal.  Has f/u with Cardiology to review Holter monitor results.  GERD also not well controlled despite Protonix 40 mg BID. Will use Tums near daily now. Breakthough HB. No dysphagia.   Review of systems:     No chest pain, no SOB, no fevers, no urinary sx   Past Medical History:  Diagnosis Date   Allergy    Anxiety    Arthritis    mild intermittent joint pain. Rt knee and left hip worst.   Diabetes mellitus without complication ([place])    "pre diabetic level" on Metformin   Environmental allergies    Frequent headaches    GERD (gastroesophageal reflux disease)    she states has heart burn alot.   Hidradenitis    History of chicken pox    Hyperlipidemia    Hypertension    Seasonal allergies    UTI (lower urinary tract infection)     Patient's surgical history, family medical history, social history, medications and allergies were all reviewed in Epic    Current Outpatient Medications  Medication Sig Dispense Refill   Adalimumab (HUMIRA PEN) 40 MG/0.4ML PNKT Inject into the skin every 14 (fourteen) days.     chlorthalidone (HYGROTON) 25 MG tablet Take 1 tablet (25 mg total) by mouth daily. 30 tablet 11   Multiple Vitamin (MULTIVITAMIN) tablet Take 1 tablet by mouth daily. Reported on 07/03/2015     ondansetron (ZOFRAN ODT) 4 MG disintegrating tablet Take 1 tablet (4 mg total) by mouth every 6 (six) hours. 30 tablet 3   pantoprazole (PROTONIX) 40 MG tablet Take 1 tablet (40 mg total) by mouth 2 (two) times daily. Take 40 mg twice a day for 8 weeks, then reduce to 40 mg daily 90 tablet 3   Vitamin D, Ergocalciferol, (DRISDOL) 1.25 MG (50000 UNIT) CAPS capsule Take 1 capsule (50,000 Units total) by mouth every 7 (seven) days. 8 capsule 0   No current facility-administered medications for this visit.    Physical Exam:     BP 130/80   Pulse 86   Ht  5' 2"  (4.920 m)   Wt 209 lb 6 oz (95 kg)   BMI 38.30 kg/m   GENERAL:  Pleasant female in NAD PSYCH: : Cooperative, normal affect EENT: 2 small ulcers, 1 on inside of lower lip and 1 on the inside of upper lip.  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  Has now had an adequate trial of Humira with breakthrough Crohn's symptoms along with uncontrolled hidradenitis suppurativa.  Reviewed results as above, and failure to call this primary nonresponder to Humira.  Discussed treatment options to include 1) shortening Humira interval, 2) adding immunomodulators, 3) changing therapy.  She was very strongly like to pursue option 3.  Discussed those treatment options to include Remicade, Rinvoq, Entyvio, MTX, immunomodulators, with plan for the following:  - Stopping Humira - Start Remicade 5 mg/kilogram.  I placed orders today for infusion at [facility]  [address] - Start MTX 25 mg SQ (50 mg/2 ml). Inject 1 mL weekly. I spoke with the pharmacist today. Will order a 3 month supply and will order separate syringes (does not come as pre-filled syringe) - TPMT 10.  Starting MTX instead of 6-MP or Imuran as above - Referral back to Dr. Johney Maine at Colorectal Surgery - Schedule appointment with Dermatology - May consider extended course of Flagyl depending on response to therapy above - Plan to check fecal calprotectin and CRP after completion of loading dose - No response to previous course of steroids.  Discussed role/utility of repeat steroids now, and she does not want to pursue given nonresponse in the  past  5) GERD - Increasing reflux symptoms despite continued high-dose Protonix - Start Dexilant 60 mg/day - Continue Pepcid 20 mg qhs - Continue antireflux lifestyle/dietary modifications   RTC in 3 months or sooner as needed  I spent 45 minutes of time, including in depth chart review, independent review of results as outlined above, communicating results with the patient directly, face-to-face time with the patient, coordinating care, ordering studies and medications as appropriate, and documentation.            [place] ,DO, FACG 10/07/2021, 10:00 AM

## 2021-10-12 LAB — CALPROTECTIN, FECAL: Calprotectin, Fecal: 6600 ug/g — ABNORMAL HIGH (ref 0–120)

## 2021-10-16 ENCOUNTER — Ambulatory Visit: Payer: 59 | Admitting: Gastroenterology

## 2021-10-16 ENCOUNTER — Encounter: Payer: Self-pay | Admitting: Gastroenterology

## 2021-10-16 ENCOUNTER — Other Ambulatory Visit: Payer: Self-pay | Admitting: Pharmacy Technician

## 2021-10-17 ENCOUNTER — Other Ambulatory Visit: Payer: Self-pay

## 2021-10-17 ENCOUNTER — Other Ambulatory Visit: Payer: Self-pay | Admitting: Gastroenterology

## 2021-10-17 DIAGNOSIS — M2550 Pain in unspecified joint: Secondary | ICD-10-CM

## 2021-10-17 MED ORDER — CELECOXIB 100 MG PO CAPS: 100.0000 mg | ORAL_CAPSULE | Freq: Two times a day (BID) | ORAL | 0 refills | Status: DC

## 2021-10-17 MED ORDER — OMEPRAZOLE 20 MG PO CPDR: 20.0000 mg | DELAYED_RELEASE_CAPSULE | Freq: Every day | ORAL | 0 refills | Status: AC

## 2021-10-17 NOTE — Addendum Note (Signed)
Addended by: Orion Modest on: 10/17/2021 03:53 PM   Modules accepted: Orders

## 2021-10-21 ENCOUNTER — Ambulatory Visit: Payer: 59

## 2021-10-22 ENCOUNTER — Encounter: Payer: Self-pay | Admitting: Gastroenterology

## 2021-10-22 ENCOUNTER — Ambulatory Visit: Payer: 59 | Admitting: Medical

## 2021-10-23 NOTE — Telephone Encounter (Signed)
Kristin Wong, I am very sorry to hear that you had been hospitalized with continued GI symptoms.  I think that is an incredibly reasonable and wise decision to be seen at [facility] autoimmune division for comprehensive and consolidated care.  Given the multiple issues and need for change in immunosuppressive therapy, I am very hopeful that they are able to tailor a therapy regimen that is highly effective for you.  Please do not hesitate to reach out to our office with any questions or concerns.

## 2021-10-30 ENCOUNTER — Telehealth: Payer: Self-pay

## 2021-10-30 NOTE — Telephone Encounter (Signed)
-----   Message from Mychart, Generic sent at 10/30/2021 10:11 AM EDT ----- Regarding: Your message may not be read Contact: [phone]    ----- Delivery failure of internet email alert  Tickler type: Message Message Id(WMG): [id number] SMTP Response: [id number] Patient: Kristin Wong,Kristin Wong([id number]) Internet alert email: [email or web address]     ----- Original WMG message to the patient ----- Sent: 10/30/2021 10:07 AM From: Orion Modest, RN To: [facility] Message Type: Patient Medical Advice Request Subject: Hospitalization  Cirigliano, Vito V, DO     Absolutely.  Vernona Rieger can you please send a referral to Rheumatology at [facility].  Thank you.     I just faxed the referral. Please let us know if you need anything else. Thank you!    ----- Message -----      From:Sahana Eduard Clos      Sent:10/25/2021 12:19 PM EDT        CX:[id number] Medical Advice Request Message List   Subject:Hospitalization   Referral  Hi can you send in a referral to Rheumatology at [facility] for me? The fax # [phone]. Thank you    ----- Message -----      From:Nurse Diamond Nickel      Sent:10/23/2021  1:48 PM EDT        CH:YIFOY Eduard Clos   Subject:Hospitalization   Cirigliano, Verlin Dike, DO     Symone, I am very sorry to hear that you had been hospitalized with continued GI symptoms.  I think that is an incredibly reasonable and wise decision to be seen at [facility] autoimmune division for comprehensive and consolidated care.  Given the multiple issues and need for change in immunosuppressive therapy, I am very hopeful that they are able to tailor a therapy regimen that is highly effective for you.  Please do not hesitate to reach out to our office with any questions or concerns.      ----- Message -----      From:Mieka Eduard Clos      Sent:10/22/2021  7:39 PM EDT        DX:AJOI V Cirigliano, DO   Subject:Hospitalization   Hello. I just wanted to make you aware that I have been in the hospital since Saturday. I came  in with diarrhea, vomiting, abdominal pain and extreme joint pain and swelling as well as HS symptoms. The doctors here have suggested that I see an autoimmune care team at [facility]. I am going to pursue this avenue of treatment. I am in no way dissatisfied with the treatment I've received from your practice, however with the multiple issues I'm having I feel more one stop shop would be a better choice for me at this time.

## 2021-10-30 NOTE — Telephone Encounter (Signed)
Called pt to let her know that referral had been sent. Pt verbalized understanding and stated she was unable to get an appt with [facility] GI until November and she will need care until appt. Pt stated she is on prednisone still from recent hospital admission and has 5 days left of prednisone. Currently pt is still having diarrhea and abd pain but no blood. The prednisone seems to be helping the joint pain and improving the diarrhea some but diarrhea is still present and pt has continued to lose weight. Pt reports she has lost about 7 pounds in the last week. Pt scheduled for f/u with Dr. Barron Alvine on 7/18 at 2:40 pm.

## 2021-10-30 NOTE — Telephone Encounter (Signed)
Chart from her recent admission reviewed.  She was admitted 6/24-28, treated with Cipro/Flagyl, IV Solu-Medrol and transitioned to prednisone.  Not sure why, but looks like she was discharged with a short tapering course of prednisone with plan to take 60 mg x 1 day, 50 mg x 2 days, 40 mg x 2 days, 30 mg x 2 days, 20 mg x 2 days, 10 mg x 2 days.  I typically do a much longer titration, with 40 mg x 2 weeks, then step down by 10 mg every 7 days.  It is possible that she is having symptoms started to breakthrough again due to rapid taper.  Please check to see which dose she is at now.  My recommendation is for 40 mg daily x2 weeks, then step down by 10 mg weekly as above.  Happy to continue helping until she is able to establish GI care at [facility].  To follow-up with me as scheduled.

## 2021-10-30 NOTE — Telephone Encounter (Signed)
Absolutely.  Vernona Rieger can you please send a referral to Rheumatology at [facility]  Thank you.

## 2021-10-31 ENCOUNTER — Other Ambulatory Visit: Payer: Self-pay

## 2021-10-31 MED ORDER — PREDNISONE 10 MG PO TABS: ORAL_TABLET | ORAL | 0 refills | Status: AC

## 2021-10-31 NOTE — Telephone Encounter (Signed)
Prednisone taper sent to pharmacy. Called pt to let her know. Pt verbalized understanding and had no other concerns at end of call.

## 2021-10-31 NOTE — Telephone Encounter (Signed)
In that case, lets plan to bump back up to 30 mg/day x2 weeks, then start slow taper to 20 mg/day x7 days, 15 mg/day x7 days, 10 mg/day x7 days, 5 mg/day x7 days, then off.

## 2021-10-31 NOTE — Telephone Encounter (Signed)
Pt is currently on 20 mg dose. Is it okay to send in the longer taper?

## 2021-10-31 NOTE — Telephone Encounter (Signed)
Left message for pt to call back  °

## 2021-11-12 ENCOUNTER — Encounter: Payer: Self-pay | Admitting: Gastroenterology

## 2021-11-12 ENCOUNTER — Ambulatory Visit (INDEPENDENT_AMBULATORY_CARE_PROVIDER_SITE_OTHER): Payer: 59 | Admitting: Gastroenterology

## 2021-11-12 ENCOUNTER — Ambulatory Visit: Payer: 59 | Admitting: Gastroenterology

## 2021-11-12 VITALS — BP 142/78 | HR 108 | Ht 62.0 in | Wt 206.0 lb

## 2021-11-12 DIAGNOSIS — L732 Hidradenitis suppurativa: Secondary | ICD-10-CM | POA: Diagnosis not present

## 2021-11-12 DIAGNOSIS — K50919 Crohn's disease, unspecified, with unspecified complications: Secondary | ICD-10-CM | POA: Diagnosis not present

## 2021-11-12 DIAGNOSIS — K603 Anal fistula: Secondary | ICD-10-CM | POA: Diagnosis not present

## 2021-11-12 DIAGNOSIS — M2550 Pain in unspecified joint: Secondary | ICD-10-CM | POA: Diagnosis not present

## 2021-11-12 DIAGNOSIS — K219 Gastro-esophageal reflux disease without esophagitis: Secondary | ICD-10-CM

## 2021-11-12 NOTE — Progress Notes (Unsigned)
Chief Complaint:    Chronic disease, arthralgias  GI History: 56 year old female with a history of HTN, anxiety, diabetes, HLD, arthritis, GERD, obesity (BMI 44), hidradenitis suppurativa s/p axillary surgery 2007, cholecystectomy 2002, initially seen in GI clinic on 02/05/2021 for evaluation of IDA, FOBT positive stool, fatigue, abdominal pain, change in bowel habits.  Developed GI symptoms in 12/2020 described as upper abdominal pain, increased gas/bloating, mucus-like stools, decreased appetite, postprandial abdominal cramping.  Started develop blood in her stools with increased urgency but no change in frequency.   - 12/28/2020: H/H 11.2/34.8, MCV/RDW 72/18.  Iron 23.  Normal B12, vitamin D, vitamin B1.  Normal CMP.  Started on oral iron - 01/11/2021: FOBT positive - 01/21/2021: H/H 12.3/38.9, MCV/RDW 73.6/20.6.  Iron 51 - 02/05/2021: Evaluated in the GI clinic.  Negative/normal GI PCR.  Fecal calprotectin 3460, ESR 59, CRP 91 - 02/08/2021: EGD: LA Grade A esophagitis, moderate antral gastritis (path: Gastritis), Moderate inflammation in duodenal bulb (path: Peptic duodenitis).  Started on Protonix 40 mg bid - 02/08/2021: Colonoscopy: Moderate inflammation in the sigmoid (altered vascularity, congestion, erythema, aphthous ulcerations and deep ulcerations; path: Severe active colitis), mild inflammation of the rectum and cecum (altered vascularity, congestion, and erythema; path: Focal active proctitis), sigmoid diverticulosis, a few mild aphthous ulcers in TI (path: Normal), Perianal fistula and skin tags.  Started on prednisone 40 mg/day x2 weeks with taper.  Referred for MRI and additional labs - 02/08/2021: TPMT 10, QuantiFERON gold negative, HBsAb-, HBsAg- - 02/25/2021: MRI pelvis: 1.5 cm intersphincteric perianal abscess along anterior wall of upper anus, sigmoid diverticulosis with mild colonic wall thickening/soft tissue stranding.  Endometrial polyps or intracavitary fibroids - 03/25/2021:  Evaluated by Dr. Johney Maine in [facility].  Plan for medical management - 06/11/2021: CT A/P: Wall thickening and surrounding stranding involving the proximal to mid sigmoid colon.  Treated in ER with Levaquin, dicyclomine - 06/13/2021: GI clinic follow-up as above.  Still with loose bloody stools.  Reports last response to prednisone.  GI PCR panel positive for norovirus.  ESR 95, CRP 16.9.  Fecal calprotectin not collected. - 07/09/2021: GI follow-up.  Started on Humira   Separately, history of GERD controlled dietary modifications and occasional Tums.  Rarely uses Protonix.  Sister with Colon Cancer, diagnosed ~age 41. Niece with UC. Father died with Lymphoma; son with hx of Lymphoma.  HPI:     Patient is a 56 y.o. female presenting to the Gastroenterology Clinic for follow-up.  Last seen by me in the office on 10/07/2021.  Was having active symptoms along with elevated CRP (43) and uncontrolled hidradenitis suppurativa.  At that time, changed Humira to Remicade, started MTX, referred to Colorectal Surgery.  Also started Celebrex for IBD related arthralgias and please referral to Rheumatology.  Since that appointment, she was admitted to [facility] 6/24-28 with flare (arthralgias, generalized abdominal pain, feal urgency, mucus-like stools, HS flare, n/v): - CT A/P: Focal colitis in sigmoid colon, fatty liver, diverticulosis. - Normal/negative GI PCR panel, C. difficile negative - Mild, stable anemia with H/H 11/33.5 on discharge - Normal CMP - Treated with Cipro/Flagyl, IV Solu-Medrol and transitioned to prednisone. - Does not think she was seen by Colorectal surgery or Rheumatology as inpatient. - She was discharged with a short tapering course of prednisone, but started to have breakthrough symptoms at 30 mg/day.  Had gone back to 40 mg/daily x2 weeks with slow taper.  Additionally, she is in the process of transitioning her outpatient care to [facility]  autoimmune division and IBD  clinic.  Has referral in place for [facility] Rheumatology.    Was scheduled to see [place] yesterday, but she cancelled due to financials. Applying for Medicaid now. Has appt scheduled for November.   Today, still with mucus-like stools, but more formed.   Not sleeping.  ?had malar rash? With Humira and worse during admission and IV steroids, then resolved with transition to PO steroids.*** Currently at 30 mg/day.    Has not had any Remicade.  Indigestion and regurgitation.   GERD improved since changing from Protonix to [place] last month.  Continues to take Pepcid 20 mg at nighttime.  Review of systems:     No chest pain, no SOB, no fevers, no urinary sx   Past Medical History:  Diagnosis Date   Allergy    Anxiety    Arthritis    mild intermittent joint pain. Rt knee and left hip worst.   Diabetes mellitus without complication ([place])    "pre diabetic level" on Metformin   Environmental allergies    Frequent headaches    GERD (gastroesophageal reflux disease)    she states has heart burn alot.   Hidradenitis    History of chicken pox    Hyperlipidemia    Hypertension    Seasonal allergies    UTI (lower urinary tract infection)     Patient's surgical history, family medical history, social history, medications and allergies were all reviewed in Epic    Current Outpatient Medications  Medication Sig Dispense Refill   chlorthalidone (HYGROTON) 25 MG tablet Take 1 tablet (25 mg total) by mouth daily. 30 tablet 11   Methotrexate, PF, 20 MG/0.4ML SOAJ Inject 0.4 Doses into the skin once a week. 4.8 mL 2   Multiple Vitamin (MULTIVITAMIN) tablet Take 1 tablet by mouth daily. Reported on 07/03/2015     omeprazole (PRILOSEC) 20 MG capsule Take 1 capsule (20 mg total) by mouth daily. 7 capsule 0   predniSONE (DELTASONE) 10 MG tablet Take 3 tablets (30 mg total) by mouth daily with breakfast for 14 days, THEN 2 tablets (20 mg total) daily with breakfast for 7 days, THEN  1.5 tablets (15 mg total) daily with breakfast for 7 days, THEN 1 tablet (10 mg total) daily with breakfast for 7 days, THEN 0.5 tablets (5 mg total) daily with breakfast for 7 days. 77 tablet 0   No current facility-administered medications for this visit.    Physical Exam:     BP (!) 142/78   Pulse (!) 108   Ht _0  (1.575 m)   Wt 206 lb (93.4 kg)   BMI 37.68 kg/m   GENERAL:  Pleasant *** in NAD PSYCH: : Cooperative, normal affect EENT:  conjunctiva pink, mucous membranes moist, neck supple without masses CARDIAC:  RRR, ***murmur heard, no peripheral edema PULM: Normal respiratory effort, lungs CTA bilaterally, no wheezing ABDOMEN:  Nondistended, soft, nontender. No obvious masses, no hepatomegaly,  normal bowel sounds SKIN:  turgor, no lesions seen Musculoskeletal:  Normal muscle tone, normal strength NEURO: Alert and oriented x 3, no focal neurologic deficits   IMPRESSION and PLAN:    1)   Schedule Remicade infusion Resume MTX Referral to Dr. Drue Flirt at Colorectal Surgery at [place] appt with [facility] (October) Planning to transition her care to [facility]. Has appt with GI at [facility] (November currently) Steroid taper Schedule f/u appt with Dermatology. She wants to hold off pending response to Remicade Discussed Rinvoq depending  on response to therapy  RTC in 3 months unless she transitions GI care to [facility] in the interim             Lavena Bullion ,DO, FACG 11/12/2021, 2:54 PM

## 2021-11-12 NOTE — Patient Instructions (Addendum)
If you are age 55 or younger, your body mass index should be between 19-25. Your Body mass index is 37.68 kg/m. If this is out of the aformentioned range listed, please consider follow up with your Primary Care Provider.   ________________________________________________________  The [place] [facility] providers would like to encourage you to use [facility] to communicate with providers for non-urgent requests or questions.  Due to long hold times on the telephone, sending your provider a message by [facility] may be a faster and more efficient way to get a response.  Please allow 48 business hours for a response.  Please remember that this is for non-urgent requests.  _______________________________________________________ Due to recent changes in healthcare laws, you may see the results of your imaging and laboratory studies on MyChart before your provider has had a chance to review them.  We understand that in some cases there may be results that are confusing or concerning to you. Not all laboratory results come back in the same time frame and the provider may be waiting for multiple results in order to interpret others.  Please give [place] 48 hours in order for your provider to thoroughly review all the results before contacting the office for clarification of your results.   You are referred to [facility] . Someone from that office will contact you to schedule a consult.  Inetta Fermo, MD. [facility] [address], [place], [place] [address] Phone: [phone]  You will be contacted by [place] to schedule your Remicade.  Follow up in 3 months.  Thank you for choosing me and  Gastroenterology.  Vito Cirigliano, D.O.

## 2021-11-19 ENCOUNTER — Ambulatory Visit: Payer: 59 | Admitting: Internal Medicine

## 2021-11-19 ENCOUNTER — Ambulatory Visit (INDEPENDENT_AMBULATORY_CARE_PROVIDER_SITE_OTHER): Payer: 59

## 2021-11-19 VITALS — BP 140/81 | HR 72 | Temp 97.6°F | Resp 18 | Ht 62.0 in | Wt 204.0 lb

## 2021-11-19 DIAGNOSIS — K50919 Crohn's disease, unspecified, with unspecified complications: Secondary | ICD-10-CM | POA: Diagnosis not present

## 2021-11-19 MED ORDER — SODIUM CHLORIDE 0.9 % IV SOLN
5.0000 mg/kg | Freq: Once | INTRAVENOUS | Status: AC
  Administered 2021-11-19: 500 mg via INTRAVENOUS
  Filled 2021-11-19: qty 50

## 2021-11-19 MED ORDER — ACETAMINOPHEN 325 MG PO TABS
650.0000 mg | ORAL_TABLET | Freq: Once | ORAL | Status: AC
  Administered 2021-11-19: 650 mg via ORAL
  Filled 2021-11-19: qty 2

## 2021-11-19 MED ORDER — DIPHENHYDRAMINE HCL 25 MG PO CAPS
25.0000 mg | ORAL_CAPSULE | Freq: Once | ORAL | Status: AC
  Administered 2021-11-19: 25 mg via ORAL
  Filled 2021-11-19: qty 1

## 2021-11-19 MED ORDER — METHYLPREDNISOLONE SODIUM SUCC 40 MG IJ SOLR
40.0000 mg | Freq: Once | INTRAMUSCULAR | Status: AC
  Administered 2021-11-19: 40 mg via INTRAVENOUS
  Filled 2021-11-19: qty 1

## 2021-11-19 NOTE — Patient Instructions (Signed)
Infliximab injection ?What is this medication? ?INFLIXIMAB (in FLIX i mab) is used to treat Crohn's disease and ulcerative colitis. It is also used to treat ankylosing spondylitis, plaque psoriasis, and some forms of arthritis. ?This medicine may be used for other purposes; ask your health care provider or pharmacist if you have questions. ?COMMON BRAND NAME(S): AVSOLA, INFLECTRA, IXIFI, Remicade, RENFLEXIS ?What should I tell my care team before I take this medication? ?They need to know if you have any of these conditions: ?cancer ?current or past resident of [place] ?diabetes ?exposure to tuberculosis ?Guillain-Barre syndrome ?heart failure ?hepatitis or liver disease ?immune system problems ?infection ?lung or breathing disease, like COPD ?multiple sclerosis ?receiving phototherapy for the skin ?seizure disorder ?an unusual or allergic reaction to infliximab, mouse proteins, other medicines, foods, dyes, or preservatives ?pregnant or trying to get pregnant ?breast-feeding ?How should I use this medication? ?This medicine is for injection into a vein. It is usually given by a health care professional in a hospital or clinic setting. ?A special MedGuide will be given to you by the pharmacist with each prescription and refill. Be sure to read this information carefully each time. ?Talk to your pediatrician regarding the use of this medicine in children. While this drug may be prescribed for children as young as 6 years of age for selected conditions, precautions do apply. ?Overdosage: If you think you have taken too much of this medicine contact a poison control center or emergency room at once. ?NOTE: This medicine is only for you. Do not share this medicine with others. ?What if I miss a dose? ?It is important not to miss your dose. Call your doctor or health care professional if you are unable to keep an appointment. ?What may interact with this medication? ?Do not take this medicine  with any of the following medications: ?biologic medicines such as abatacept, adalimumab, anakinra, certolizumab, etanercept, golimumab, rituximab, secukinumab, tocilizumab, tofactinib, ustekinumab ?live vaccines ?This list may not describe all possible interactions. Give your health care provider a list of all the medicines, herbs, non-prescription drugs, or dietary supplements you use. Also tell them if you smoke, drink alcohol, or use illegal drugs. Some items may interact with your medicine. ?What should I watch for while using this medication? ?Your condition will be monitored carefully while you are receiving this medicine. Visit your doctor or health care professional for regular checks on your progress. You may need blood work done while you are taking this medicine. Before beginning therapy, your doctor may do a test to see if you have been exposed to tuberculosis. ?Call your doctor or health care professional for advice if you get a fever, chills or sore throat, or other symptoms of a cold or flu. Do not treat yourself. This drug decreases your body's ability to fight infections. Try to avoid being around people who are sick. ?This medicine may make the symptoms of heart failure worse in some patients. If you notice symptoms such as increased shortness of breath or swelling of the ankles or legs, contact your health care provider right away. ?If you are going to have surgery or dental work, tell your health care professional or dentist that you have received this medicine. ?If you take this medicine for plaque psoriasis, stay out of the sun. If you cannot avoid being in the sun, wear protective clothing and use sunscreen. Do not use sun lamps or tanning beds/booths. ?Talk to your doctor about your risk of cancer. You may   be more at risk for certain types of cancers if you take this medicine. ?What side effects may I notice from receiving this medication? ?Side effects that you should report to your doctor  or health care professional as soon as possible: ?allergic reactions like skin rash, itching or hives, swelling of the face, lips, or tongue ?breathing problems ?changes in vision ?chest pain ?fever or chills, usually related to the infusion ?joint pain ?pain, tingling, numbness in the hands or feet ?redness, blistering, peeling or loosening of the skin, including inside the mouth ?seizures ?signs of infection - fever or chills, cough, sore throat, flu-like symptoms, pain or difficulty passing urine ?signs and symptoms of liver injury like dark yellow or brown urine; general ill feeling; light-colored stools; loss of appetite; nausea; right upper belly pain; unusually weak or tired; yellowing of the eyes or skin ?signs and symptoms of a stroke like changes in vision; confusion; trouble speaking or understanding; severe headaches; sudden numbness or weakness of the face, arm or leg; trouble walking; dizziness; loss of balance or coordination ?swelling of the ankles, feet, or hands ?swollen lymph nodes in the neck, underarm, or groin areas ?unusual bleeding or bruising ?unusually weak or tired ?Side effects that usually do not require medical attention (report to your doctor or health care professional if they continue or are bothersome): ?headache ?nausea ?stomach pain ?upset stomach ?This list may not describe all possible side effects. Call your doctor for medical advice about side effects. You may report side effects to FDA at 1-800-FDA-1088. ?Where should I keep my medication? ?This drug is given in a hospital or clinic and will not be stored at home. ?NOTE: This sheet is a summary. It may not cover all possible information. If you have questions about this medicine, talk to your doctor, pharmacist, or health care provider. ?? 2023 Elsevier/Gold Standard (2016-05-14 00:00:00) ? ?

## 2021-11-19 NOTE — Progress Notes (Signed)
Diagnosis: Crohn's Disease  Provider:  Chilton Greathouse, MD  Procedure: Infusion  IV Type: Peripheral, IV Location: R Antecubital  Avsola (Infliximab-axxq), Dose: 500mg   Infusion Start Time: 1101  Infusion Stop Time: 1317  Post Infusion IV Care: Observation period completed and Peripheral IV Discontinued  Discharge: Condition: Good, Destination: Home . AVS provided to patient.   Performed by:  , LPN

## 2021-12-03 ENCOUNTER — Ambulatory Visit (INDEPENDENT_AMBULATORY_CARE_PROVIDER_SITE_OTHER): Payer: 59 | Admitting: *Deleted

## 2021-12-03 ENCOUNTER — Encounter: Payer: Self-pay | Admitting: Internal Medicine

## 2021-12-03 VITALS — BP 116/79 | HR 88 | Temp 98.5°F | Resp 16 | Ht 62.0 in | Wt 207.8 lb

## 2021-12-03 DIAGNOSIS — K50919 Crohn's disease, unspecified, with unspecified complications: Secondary | ICD-10-CM

## 2021-12-03 MED ORDER — DIPHENHYDRAMINE HCL 25 MG PO CAPS
25.0000 mg | ORAL_CAPSULE | Freq: Once | ORAL | Status: AC
  Administered 2021-12-03: 25 mg via ORAL
  Filled 2021-12-03: qty 1

## 2021-12-03 MED ORDER — METHYLPREDNISOLONE SODIUM SUCC 40 MG IJ SOLR
40.0000 mg | Freq: Once | INTRAMUSCULAR | Status: AC
  Administered 2021-12-03: 40 mg via INTRAVENOUS
  Filled 2021-12-03: qty 1

## 2021-12-03 MED ORDER — SODIUM CHLORIDE 0.9 % IV SOLN
5.0000 mg/kg | Freq: Once | INTRAVENOUS | Status: AC
  Administered 2021-12-03: 500 mg via INTRAVENOUS
  Filled 2021-12-03: qty 50

## 2021-12-03 MED ORDER — ACETAMINOPHEN 325 MG PO TABS
650.0000 mg | ORAL_TABLET | Freq: Once | ORAL | Status: AC
  Administered 2021-12-03: 650 mg via ORAL
  Filled 2021-12-03: qty 2

## 2021-12-03 NOTE — Progress Notes (Signed)
Diagnosis: Crohn's Disease  Provider:  Chilton Greathouse, MD  Procedure: Infusion  IV Type: Peripheral, IV Location: L Antecubital Restarted  L Hand  Remicade (Infliximab), Dose: 500 mg  Infusion start time:1041 am,1127 am restarted  Infusion Stop Time: 1059am  infiltrated,1356 pm stopped  Post Infusion IV Care: Observation period completed and Peripheral IV Discontinued  Discharge: Condition: Good, Destination: Home . AVS provided to patient.   Performed by:  Forrest Moron, RN

## 2021-12-17 ENCOUNTER — Telehealth: Payer: Self-pay | Admitting: Gastroenterology

## 2021-12-17 DIAGNOSIS — K219 Gastro-esophageal reflux disease without esophagitis: Secondary | ICD-10-CM

## 2021-12-17 DIAGNOSIS — K50919 Crohn's disease, unspecified, with unspecified complications: Secondary | ICD-10-CM

## 2021-12-17 DIAGNOSIS — K603 Anal fistula: Secondary | ICD-10-CM

## 2021-12-17 MED ORDER — METHOTREXATE (PF) 20 MG/0.4ML ~~LOC~~ SOAJ: 0.4000 | SUBCUTANEOUS | 2 refills | Status: AC

## 2021-12-17 NOTE — Telephone Encounter (Signed)
Medication sent to [facility], patient is informed.

## 2021-12-17 NOTE — Telephone Encounter (Signed)
Patient called requesting to transfer her Methotrexate script sent to [facility] in [place] instead.

## 2021-12-31 ENCOUNTER — Ambulatory Visit (INDEPENDENT_AMBULATORY_CARE_PROVIDER_SITE_OTHER): Payer: Self-pay

## 2021-12-31 VITALS — BP 133/82 | HR 82 | Temp 98.3°F | Resp 18 | Ht 62.0 in | Wt 213.4 lb

## 2021-12-31 DIAGNOSIS — K50919 Crohn's disease, unspecified, with unspecified complications: Secondary | ICD-10-CM

## 2021-12-31 MED ORDER — METHYLPREDNISOLONE SODIUM SUCC 40 MG IJ SOLR
40.0000 mg | Freq: Once | INTRAMUSCULAR | Status: AC
  Administered 2021-12-31: 40 mg via INTRAVENOUS
  Filled 2021-12-31: qty 1

## 2021-12-31 MED ORDER — ACETAMINOPHEN 325 MG PO TABS
650.0000 mg | ORAL_TABLET | Freq: Once | ORAL | Status: AC
  Administered 2021-12-31: 650 mg via ORAL
  Filled 2021-12-31: qty 2

## 2021-12-31 MED ORDER — SODIUM CHLORIDE 0.9 % IV SOLN
5.0000 mg/kg | Freq: Once | INTRAVENOUS | Status: AC
  Administered 2021-12-31: 500 mg via INTRAVENOUS
  Filled 2021-12-31: qty 50

## 2021-12-31 MED ORDER — DIPHENHYDRAMINE HCL 25 MG PO CAPS
25.0000 mg | ORAL_CAPSULE | Freq: Once | ORAL | Status: AC
  Administered 2021-12-31: 25 mg via ORAL
  Filled 2021-12-31: qty 1

## 2021-12-31 NOTE — Progress Notes (Signed)
Diagnosis: Crohn's Disease  Provider:  Chilton Greathouse MD  Procedure: Infusion  IV Type: Peripheral, IV Location: R Forearm  Avsola (infliximab-axxq), Dose: 500 mg  Infusion Start Time: 1001  Infusion Stop Time: 1220  Post Infusion IV Care: Peripheral IV Discontinued  Discharge: Condition: Good, Destination: Home . AVS Declined.  Performed by:  Garnette Czech, RN

## 2022-01-11 ENCOUNTER — Encounter: Payer: Self-pay | Admitting: Gastroenterology

## 2022-01-13 NOTE — Telephone Encounter (Signed)
Kristin Wong,  Can you please assist in coordinating transfer of her infusions to [facility] and assist in getting her transferred to a GI that accepts her insurance so that she can continue treatment without pause. I am happy to see her until she is able to transfer her care, but certainly understand with the insurance issue and following in our office. Can she be seen by [facility] or [facility]?

## 2022-01-14 NOTE — Telephone Encounter (Signed)
Referral faxed to [facility] at [phone].

## 2022-01-23 ENCOUNTER — Ambulatory Visit: Payer: 59 | Admitting: Internal Medicine

## 2022-01-26 ENCOUNTER — Other Ambulatory Visit: Payer: Self-pay | Admitting: Medical

## 2022-02-13 ENCOUNTER — Encounter: Payer: Self-pay | Admitting: Gastroenterology

## 2022-02-13 NOTE — Telephone Encounter (Signed)
Pt is scheduled for appointment with Dr. Nyoka Cowden on 12/7. [facility] to see if pt could have infusion at their facility before appointment on 12/7. Message was sent to nurse.

## 2022-02-18 ENCOUNTER — Encounter: Payer: Self-pay | Admitting: Gastroenterology

## 2022-02-18 NOTE — Telephone Encounter (Signed)
Spoke with [facility] and they stated that pt is unable to use their infusion sites without being seen first. They were able to get pt in to be seen with Damian Leavell PA on Monday October 30th and wanted pt to arrive 20 min early to fill out new patient paperwork. Called pt and let her know about appointment and pt verbalized understanding.

## 2022-02-22 ENCOUNTER — Encounter: Payer: Self-pay | Admitting: Gastroenterology

## 2022-02-24 ENCOUNTER — Encounter: Payer: Self-pay | Admitting: Gastroenterology

## 2022-02-25 ENCOUNTER — Encounter: Payer: Self-pay | Admitting: Gastroenterology

## 2022-02-25 ENCOUNTER — Ambulatory Visit: Payer: BLUE CROSS/BLUE SHIELD

## 2022-03-11 ENCOUNTER — Telehealth: Payer: Self-pay | Admitting: Pharmacy Technician

## 2022-03-11 ENCOUNTER — Other Ambulatory Visit (HOSPITAL_COMMUNITY): Payer: Self-pay

## 2022-03-11 ENCOUNTER — Encounter: Payer: Self-pay | Admitting: Gastroenterology

## 2022-03-11 NOTE — Telephone Encounter (Signed)
Patient Advocate Encounter  Received notification from [facility] that prior authorization for OTREXUP 20MG  is required.   PA submitted on 11.14.23 Key [id number] Status is pending    11.16.23, CPhT Patient Advocate Phone: [phone]

## 2022-12-15 ENCOUNTER — Other Ambulatory Visit: Payer: Self-pay | Admitting: Gastroenterology

## 2023-07-25 IMAGING — MR MR PELVIS WO/W CM
5 of 7 series · 33 of 48 positions shown · IV contrast (gadavist)
Comparison: None.

CLINICAL DATA: Perianal fistula.  Crohn disease.

EXAM:
MRI PELVIS WITHOUT AND WITH CONTRAST
TECHNIQUE: Multiplanar multisequence MR imaging of the pelvis was performed
both before and after administration of intravenous contrast.
CONTRAST:  10mL GADAVIST GADOBUTROL 1 MMOL/ML IV SOLN

[Series 2: T2 · sagittal · 3.0mm · 1.08mm/px · 9 of 43 slices shown (1 of 2)]
[im 1/43]
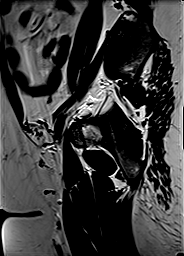
[im 6/43]
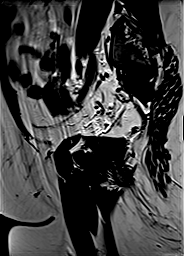
[im 11/43]
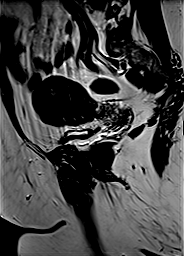
[im 16/43]
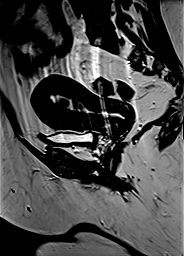
[im 22/43]
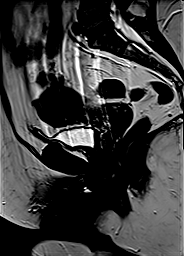
[im 27/43]
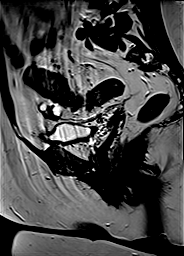
[im 32/43]
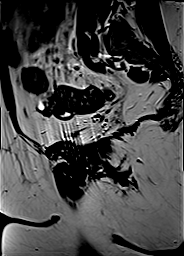
[im 37/43]
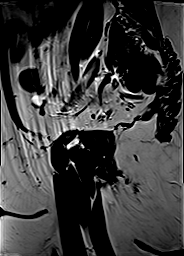
[im 43/43]
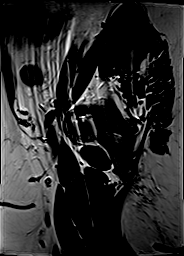

[Series 3: T2 fat-sat · sagittal · 3.0mm · 1.09mm/px · 9 of 43 slices shown (1 of 2)]
[im 1/43]
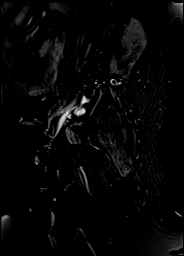
[im 6/43]
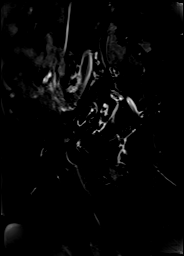
[im 11/43]
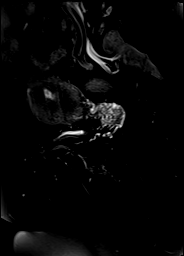
[im 16/43]
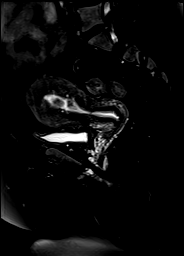
[im 22/43]
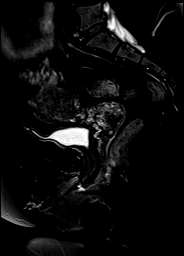
[im 27/43]
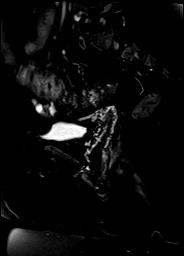
[im 32/43]
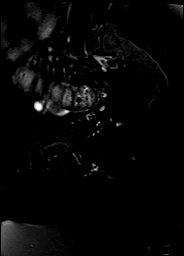
[im 37/43]
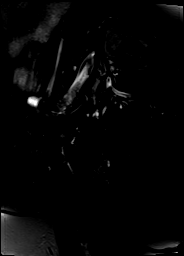
[im 43/43]
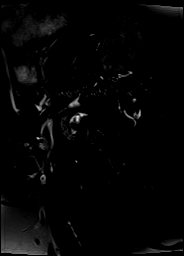

[Series 4: T1 · axial · 4.0mm · 0.43mm/px · z∈[-94,+48]mm · 6 of 34 slices shown]
[im 1/34]
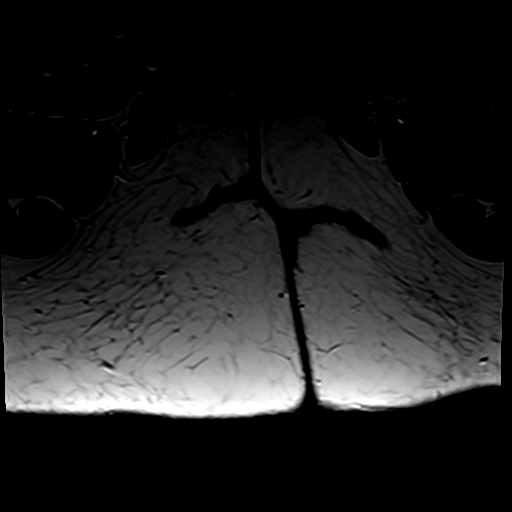
[im 7/34]
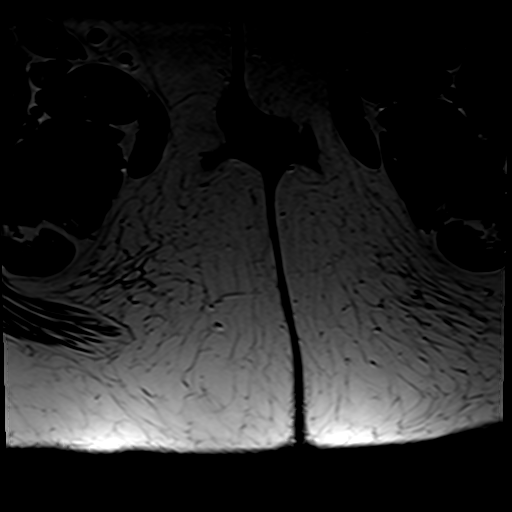
[im 14/34]
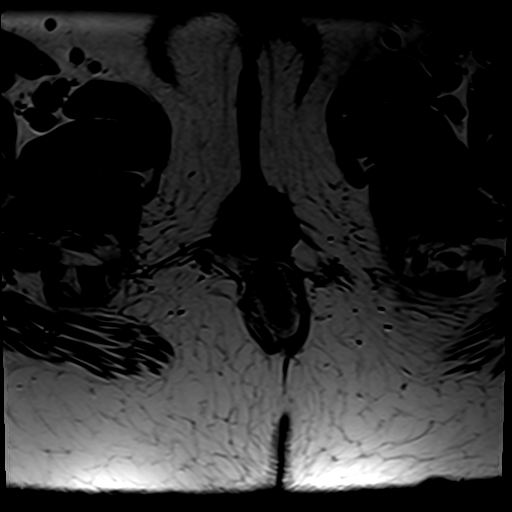
[im 20/34]
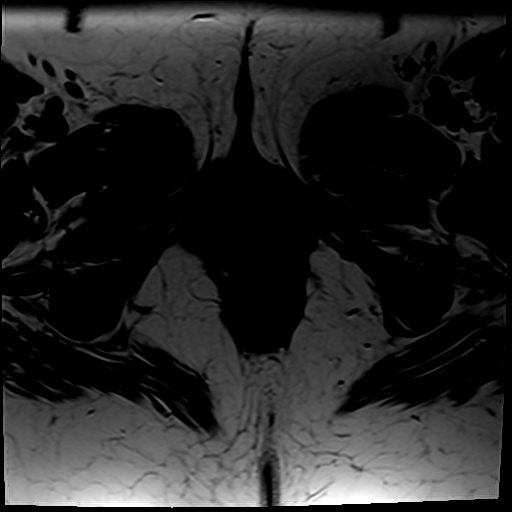
[im 27/34]
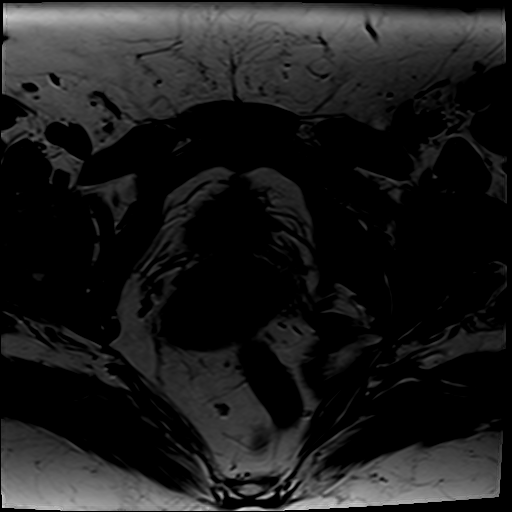
[im 34/34]
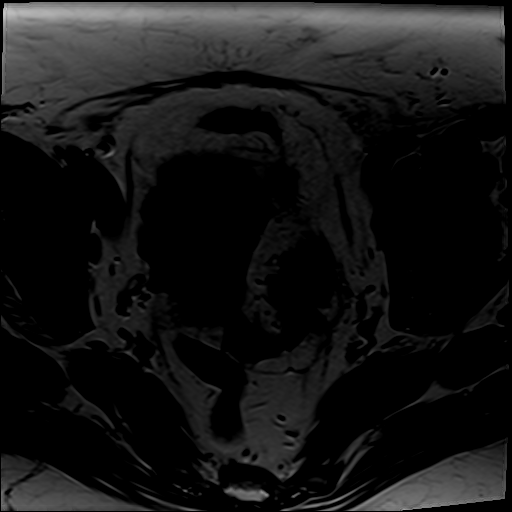

[Series 5: T2 · axial · 4.0mm · 0.43mm/px · z∈[-92,+50]mm · 6 of 34 slices shown (2 of 2)]
[im 1/34]
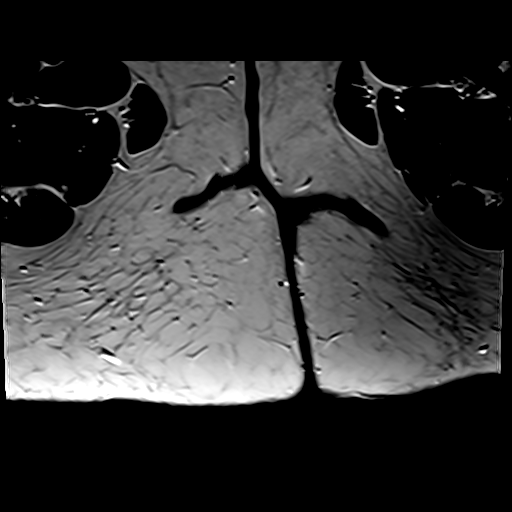
[im 7/34]
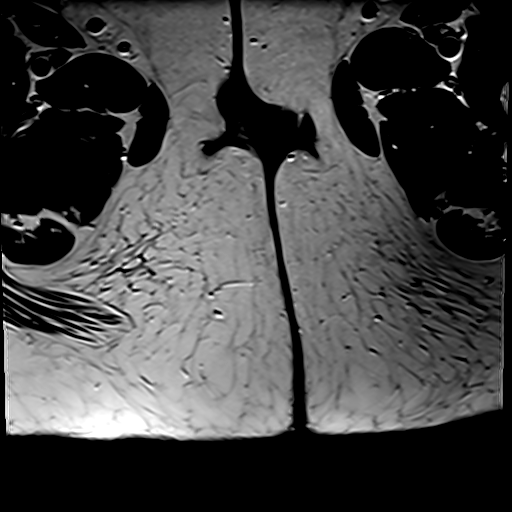
[im 14/34]
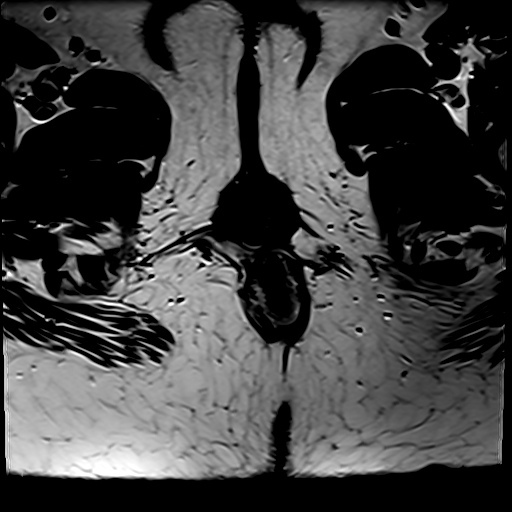
[im 20/34]
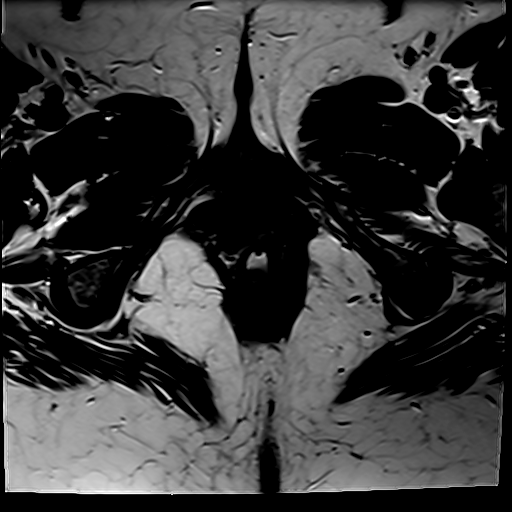
[im 27/34]
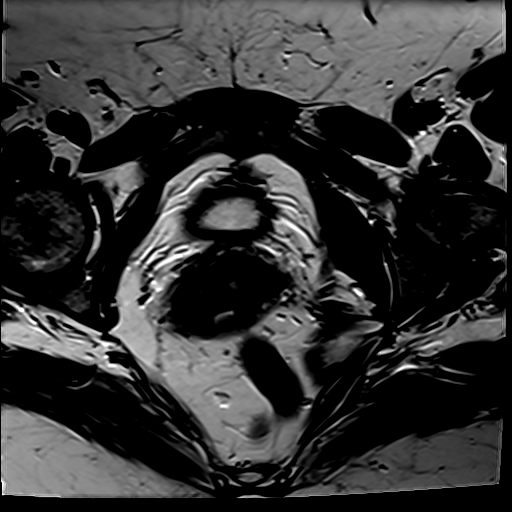
[im 34/34]
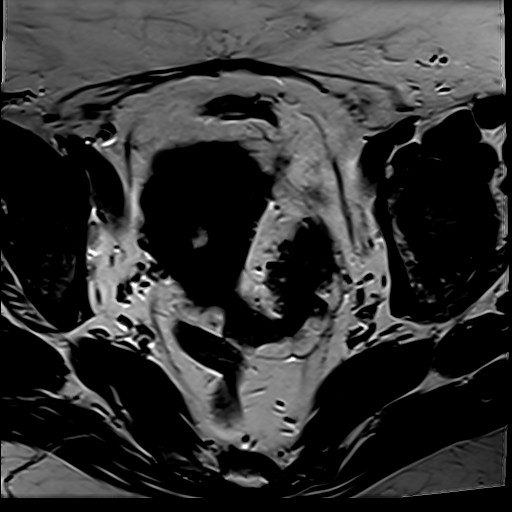

[Series 6: T2 fat-sat · axial · 4.0mm · 0.43mm/px · z∈[-94,-38]mm · 3 of 34 slices shown (2 of 2)]
[im 1/34]
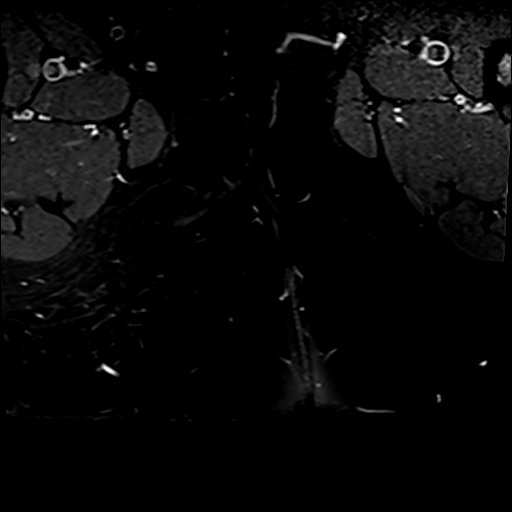
[im 7/34]
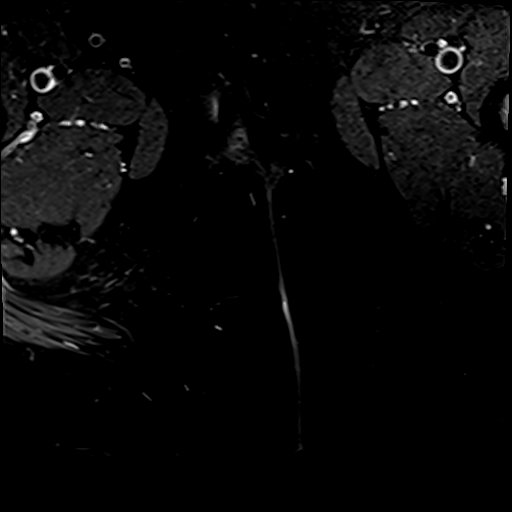
[im 14/34]
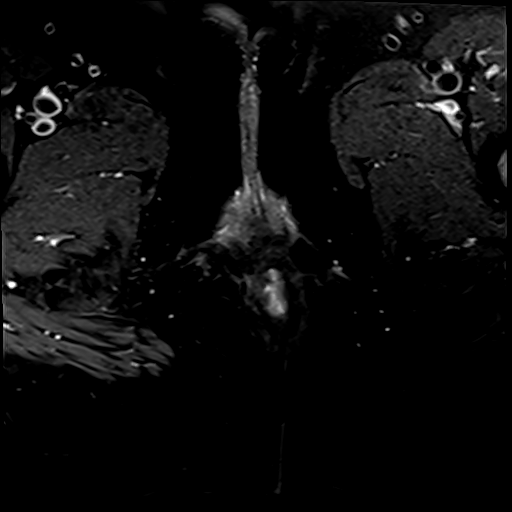

[33 of 48 positions shown; findings below may reference images not displayed]

FINDINGS: Lower Urinary Tract: Unremarkable urinary bladder.

Bowel: A small focal fluid and gas collection is seen in the
intersphincteric space along the anterior wall of the upper anus at
the 12 o'clock position. This measures 1.5 by 0.8 cm on images [DATE]
and [DATE], consistent with an intersphincteric perianal abscess.
Diverticulosis is also seen involving the sigmoid colon, with mild
colonic wall thickening pericolonic soft tissue stranding,
suspicious for mild diverticulitis.a

Vascular/Lymphatic: No pathologically enlarged lymph nodes or other
significant abnormality seen in lower pelvis.

Reproductive: At least 2 polypoid densities are seen in the
endometrial cavity which measure 1.2 cm and 1.5 cm and show T2
hypointensity. These may represent endometrial polyps or
intracavitary fibroids.

Other: None.

Musculoskeletal: No significant abnormality identified.
IMPRESSION: 1.5 cm intersphincteric perianal abscess along the anterior wall of
the upper anus at the 12 o'clock position. ([REDACTED] Classification - grade 2).

Diverticulosis involving the sigmoid colon, with probable mild
sigmoid diverticulitis.

At least 2 small polypoid densities in the endometrial cavity, which
may represent endometrial polyps or intracavitary fibroids. Further
characterization with pelvic ultrasound is suggested.
